# Patient Record
Sex: Female | Born: 1975 | Race: Black or African American | Hispanic: No | Marital: Married | Smoking: Former smoker
Health system: Southern US, Community
[De-identification: ages and names within clinical notes are randomized; demographics above are authoritative.]

## PROBLEM LIST (undated history)

## (undated) DIAGNOSIS — R002 Palpitations: Secondary | ICD-10-CM

## (undated) DIAGNOSIS — D649 Anemia, unspecified: Secondary | ICD-10-CM

## (undated) DIAGNOSIS — N946 Dysmenorrhea, unspecified: Secondary | ICD-10-CM

## (undated) DIAGNOSIS — F411 Generalized anxiety disorder: Secondary | ICD-10-CM

## (undated) DIAGNOSIS — F32A Depression, unspecified: Secondary | ICD-10-CM

## (undated) DIAGNOSIS — E119 Type 2 diabetes mellitus without complications: Secondary | ICD-10-CM

## (undated) DIAGNOSIS — Z973 Presence of spectacles and contact lenses: Secondary | ICD-10-CM

## (undated) DIAGNOSIS — D259 Leiomyoma of uterus, unspecified: Secondary | ICD-10-CM

## (undated) DIAGNOSIS — I10 Essential (primary) hypertension: Secondary | ICD-10-CM

## (undated) DIAGNOSIS — Z87410 Personal history of cervical dysplasia: Secondary | ICD-10-CM

## (undated) DIAGNOSIS — R519 Headache, unspecified: Secondary | ICD-10-CM

## (undated) DIAGNOSIS — N920 Excessive and frequent menstruation with regular cycle: Secondary | ICD-10-CM

## (undated) DIAGNOSIS — F419 Anxiety disorder, unspecified: Secondary | ICD-10-CM

## (undated) DIAGNOSIS — F329 Major depressive disorder, single episode, unspecified: Secondary | ICD-10-CM

## (undated) DIAGNOSIS — R51 Headache: Secondary | ICD-10-CM

## (undated) HISTORY — DX: Major depressive disorder, single episode, unspecified: F32.9

## (undated) HISTORY — DX: Depression, unspecified: F32.A

## (undated) HISTORY — DX: Headache: R51

## (undated) HISTORY — DX: Headache, unspecified: R51.9

---

## 1997-04-21 ENCOUNTER — Inpatient Hospital Stay (HOSPITAL_COMMUNITY): Admission: AD | Admit: 1997-04-21 | Discharge: 1997-04-21 | Payer: Self-pay | Admitting: Obstetrics and Gynecology

## 1997-05-13 ENCOUNTER — Inpatient Hospital Stay (HOSPITAL_COMMUNITY): Admission: AD | Admit: 1997-05-13 | Discharge: 1997-05-13 | Payer: Self-pay | Admitting: Obstetrics and Gynecology

## 1997-06-10 ENCOUNTER — Inpatient Hospital Stay (HOSPITAL_COMMUNITY): Admission: AD | Admit: 1997-06-10 | Discharge: 1997-06-10 | Payer: Self-pay | Admitting: Obstetrics and Gynecology

## 1997-07-22 ENCOUNTER — Encounter (HOSPITAL_COMMUNITY): Admission: RE | Admit: 1997-07-22 | Discharge: 1997-08-12 | Payer: Self-pay | Admitting: Obstetrics and Gynecology

## 1997-07-23 ENCOUNTER — Inpatient Hospital Stay (HOSPITAL_COMMUNITY): Admission: AD | Admit: 1997-07-23 | Discharge: 1997-07-23 | Payer: Self-pay | Admitting: Obstetrics and Gynecology

## 1997-07-28 ENCOUNTER — Inpatient Hospital Stay (HOSPITAL_COMMUNITY): Admission: AD | Admit: 1997-07-28 | Discharge: 1997-07-28 | Payer: Self-pay | Admitting: Obstetrics and Gynecology

## 1997-08-12 ENCOUNTER — Encounter (HOSPITAL_COMMUNITY): Admission: RE | Admit: 1997-08-12 | Discharge: 1997-09-04 | Payer: Self-pay | Admitting: Ophthalmology

## 1997-08-20 ENCOUNTER — Inpatient Hospital Stay (HOSPITAL_COMMUNITY): Admission: AD | Admit: 1997-08-20 | Discharge: 1997-08-20 | Payer: Self-pay | Admitting: Obstetrics and Gynecology

## 1997-08-27 ENCOUNTER — Other Ambulatory Visit: Admission: RE | Admit: 1997-08-27 | Discharge: 1997-08-27 | Payer: Self-pay | Admitting: Gynecology

## 1997-09-04 ENCOUNTER — Inpatient Hospital Stay (HOSPITAL_COMMUNITY): Admission: AD | Admit: 1997-09-04 | Discharge: 1997-09-07 | Payer: Self-pay | Admitting: Gynecology

## 1997-11-10 ENCOUNTER — Other Ambulatory Visit: Admission: RE | Admit: 1997-11-10 | Discharge: 1997-11-10 | Payer: Self-pay | Admitting: Gynecology

## 1997-12-09 ENCOUNTER — Emergency Department (HOSPITAL_COMMUNITY): Admission: EM | Admit: 1997-12-09 | Discharge: 1997-12-09 | Payer: Self-pay | Admitting: Emergency Medicine

## 1998-08-18 ENCOUNTER — Emergency Department (HOSPITAL_COMMUNITY): Admission: EM | Admit: 1998-08-18 | Discharge: 1998-08-19 | Payer: Self-pay | Admitting: Emergency Medicine

## 1999-12-21 ENCOUNTER — Other Ambulatory Visit: Admission: RE | Admit: 1999-12-21 | Discharge: 1999-12-21 | Payer: Self-pay | Admitting: Gynecology

## 1999-12-21 ENCOUNTER — Encounter (INDEPENDENT_AMBULATORY_CARE_PROVIDER_SITE_OTHER): Payer: Self-pay | Admitting: Specialist

## 2000-01-26 ENCOUNTER — Emergency Department (HOSPITAL_COMMUNITY): Admission: EM | Admit: 2000-01-26 | Discharge: 2000-01-26 | Payer: Self-pay | Admitting: *Deleted

## 2000-04-04 ENCOUNTER — Emergency Department (HOSPITAL_COMMUNITY): Admission: EM | Admit: 2000-04-04 | Discharge: 2000-04-04 | Payer: Self-pay | Admitting: Emergency Medicine

## 2001-01-01 ENCOUNTER — Other Ambulatory Visit: Admission: RE | Admit: 2001-01-01 | Discharge: 2001-01-01 | Payer: Self-pay | Admitting: Gynecology

## 2001-12-25 ENCOUNTER — Other Ambulatory Visit: Admission: RE | Admit: 2001-12-25 | Discharge: 2001-12-25 | Payer: Self-pay | Admitting: Family Medicine

## 2002-10-01 ENCOUNTER — Emergency Department (HOSPITAL_COMMUNITY): Admission: EM | Admit: 2002-10-01 | Discharge: 2002-10-01 | Payer: Self-pay | Admitting: Emergency Medicine

## 2003-02-08 ENCOUNTER — Emergency Department (HOSPITAL_COMMUNITY): Admission: EM | Admit: 2003-02-08 | Discharge: 2003-02-08 | Payer: Self-pay | Admitting: Emergency Medicine

## 2003-06-30 ENCOUNTER — Emergency Department (HOSPITAL_COMMUNITY): Admission: EM | Admit: 2003-06-30 | Discharge: 2003-06-30 | Payer: Self-pay | Admitting: Emergency Medicine

## 2005-01-17 ENCOUNTER — Other Ambulatory Visit: Admission: RE | Admit: 2005-01-17 | Discharge: 2005-01-17 | Payer: Self-pay | Admitting: Gynecology

## 2005-03-21 ENCOUNTER — Encounter: Admission: RE | Admit: 2005-03-21 | Discharge: 2005-06-19 | Payer: Self-pay | Admitting: Gynecology

## 2005-05-13 ENCOUNTER — Inpatient Hospital Stay (HOSPITAL_COMMUNITY): Admission: AD | Admit: 2005-05-13 | Discharge: 2005-05-13 | Payer: Self-pay | Admitting: Gynecology

## 2005-07-26 ENCOUNTER — Inpatient Hospital Stay (HOSPITAL_COMMUNITY): Admission: AD | Admit: 2005-07-26 | Discharge: 2005-07-28 | Payer: Self-pay | Admitting: Gynecology

## 2005-07-26 ENCOUNTER — Encounter (INDEPENDENT_AMBULATORY_CARE_PROVIDER_SITE_OTHER): Payer: Self-pay | Admitting: *Deleted

## 2005-09-13 ENCOUNTER — Other Ambulatory Visit: Admission: RE | Admit: 2005-09-13 | Discharge: 2005-09-13 | Payer: Self-pay | Admitting: Gynecology

## 2006-04-22 ENCOUNTER — Emergency Department (HOSPITAL_COMMUNITY): Admission: EM | Admit: 2006-04-22 | Discharge: 2006-04-22 | Payer: Self-pay | Admitting: Emergency Medicine

## 2007-01-18 ENCOUNTER — Other Ambulatory Visit: Admission: RE | Admit: 2007-01-18 | Discharge: 2007-01-18 | Payer: Self-pay | Admitting: Family Medicine

## 2007-03-01 ENCOUNTER — Emergency Department (HOSPITAL_COMMUNITY): Admission: EM | Admit: 2007-03-01 | Discharge: 2007-03-01 | Payer: Self-pay | Admitting: Emergency Medicine

## 2007-05-07 ENCOUNTER — Emergency Department (HOSPITAL_COMMUNITY): Admission: EM | Admit: 2007-05-07 | Discharge: 2007-05-07 | Payer: Self-pay | Admitting: Emergency Medicine

## 2008-03-14 HISTORY — PX: TUBAL LIGATION: SHX77

## 2008-07-29 ENCOUNTER — Emergency Department (HOSPITAL_COMMUNITY): Admission: EM | Admit: 2008-07-29 | Discharge: 2008-07-29 | Payer: Self-pay | Admitting: Emergency Medicine

## 2008-08-07 ENCOUNTER — Ambulatory Visit: Payer: Self-pay | Admitting: Gynecology

## 2008-08-08 ENCOUNTER — Ambulatory Visit: Payer: Self-pay | Admitting: Gynecology

## 2008-09-04 ENCOUNTER — Inpatient Hospital Stay (HOSPITAL_COMMUNITY): Admission: AD | Admit: 2008-09-04 | Discharge: 2008-09-05 | Payer: Self-pay | Admitting: Obstetrics and Gynecology

## 2009-03-13 ENCOUNTER — Inpatient Hospital Stay (HOSPITAL_COMMUNITY): Admission: AD | Admit: 2009-03-13 | Discharge: 2009-03-15 | Payer: Self-pay | Admitting: Obstetrics and Gynecology

## 2009-03-13 HISTORY — PX: TUBAL LIGATION: SHX77

## 2009-03-16 ENCOUNTER — Encounter (INDEPENDENT_AMBULATORY_CARE_PROVIDER_SITE_OTHER): Payer: Self-pay | Admitting: Obstetrics and Gynecology

## 2009-07-22 ENCOUNTER — Emergency Department (HOSPITAL_COMMUNITY): Admission: EM | Admit: 2009-07-22 | Discharge: 2009-07-22 | Payer: Self-pay | Admitting: Emergency Medicine

## 2010-03-04 ENCOUNTER — Inpatient Hospital Stay (HOSPITAL_COMMUNITY)
Admission: AD | Admit: 2010-03-04 | Discharge: 2010-03-04 | Payer: Self-pay | Source: Home / Self Care | Attending: Obstetrics & Gynecology | Admitting: Obstetrics & Gynecology

## 2010-03-25 ENCOUNTER — Ambulatory Visit: Admit: 2010-03-25 | Payer: Self-pay | Admitting: Obstetrics and Gynecology

## 2010-05-24 LAB — CBC
HCT: 34.2 % — ABNORMAL LOW (ref 36.0–46.0)
Hemoglobin: 11.4 g/dL — ABNORMAL LOW (ref 12.0–15.0)
MCH: 29.5 pg (ref 26.0–34.0)
MCHC: 33.3 g/dL (ref 30.0–36.0)
MCV: 88.4 fL (ref 78.0–100.0)
Platelets: 256 10*3/uL (ref 150–400)
RBC: 3.87 MIL/uL (ref 3.87–5.11)
RDW: 12.7 % (ref 11.5–15.5)
WBC: 7.1 10*3/uL (ref 4.0–10.5)

## 2010-05-24 LAB — URINALYSIS, ROUTINE W REFLEX MICROSCOPIC
Bilirubin Urine: NEGATIVE
Glucose, UA: NEGATIVE mg/dL
Ketones, ur: 15 mg/dL — AB
Nitrite: POSITIVE — AB
Protein, ur: >300 mg/dL — AB
Specific Gravity, Urine: >1.03 — ABNORMAL HIGH (ref 1.005–1.030)
Urobilinogen, UA: 2 mg/dL — ABNORMAL HIGH (ref 0.0–1.0)
pH: 5 (ref 5.0–8.0)

## 2010-05-24 LAB — URINE MICROSCOPIC-ADD ON

## 2010-05-24 LAB — POCT PREGNANCY, URINE: Preg Test, Ur: NEGATIVE

## 2010-05-30 LAB — GLUCOSE, CAPILLARY
Glucose-Capillary: 103 mg/dL — ABNORMAL HIGH (ref 70–99)
Glucose-Capillary: 105 mg/dL — ABNORMAL HIGH (ref 70–99)
Glucose-Capillary: 107 mg/dL — ABNORMAL HIGH (ref 70–99)
Glucose-Capillary: 126 mg/dL — ABNORMAL HIGH (ref 70–99)
Glucose-Capillary: 90 mg/dL (ref 70–99)

## 2010-05-30 LAB — CBC
HCT: 32.1 % — ABNORMAL LOW (ref 36.0–46.0)
Hemoglobin: 10.9 g/dL — ABNORMAL LOW (ref 12.0–15.0)
MCHC: 34 g/dL (ref 30.0–36.0)
MCV: 94.4 fL (ref 78.0–100.0)
Platelets: 375 10*3/uL (ref 150–400)
RBC: 3.4 MIL/uL — ABNORMAL LOW (ref 3.87–5.11)
RDW: 12.7 % (ref 11.5–15.5)
WBC: 11.9 10*3/uL — ABNORMAL HIGH (ref 4.0–10.5)

## 2010-06-01 LAB — DIFFERENTIAL
Basophils Absolute: 0 10*3/uL (ref 0.0–0.1)
Basophils Relative: 0 % (ref 0–1)
Eosinophils Absolute: 0 10*3/uL (ref 0.0–0.7)
Eosinophils Relative: 1 % (ref 0–5)
Lymphocytes Relative: 16 % (ref 12–46)
Lymphs Abs: 1.1 10*3/uL (ref 0.7–4.0)
Monocytes Absolute: 0.3 10*3/uL (ref 0.1–1.0)
Monocytes Relative: 4 % (ref 3–12)
Neutro Abs: 5.3 10*3/uL (ref 1.7–7.7)
Neutrophils Relative %: 79 % — ABNORMAL HIGH (ref 43–77)

## 2010-06-01 LAB — BASIC METABOLIC PANEL
BUN: 8 mg/dL (ref 6–23)
CO2: 27 mEq/L (ref 19–32)
Calcium: 8.8 mg/dL (ref 8.4–10.5)
Chloride: 106 mEq/L (ref 96–112)
Creatinine, Ser: 0.62 mg/dL (ref 0.4–1.2)
GFR calc Af Amer: >60 mL/min (ref >60)
GFR calc non Af Amer: >60 mL/min (ref >60)
Glucose, Bld: 109 mg/dL — ABNORMAL HIGH (ref 70–99)
Potassium: 3.2 mEq/L — ABNORMAL LOW (ref 3.5–5.1)
Sodium: 140 mEq/L (ref 135–145)

## 2010-06-01 LAB — URINALYSIS, ROUTINE W REFLEX MICROSCOPIC
Glucose, UA: NEGATIVE mg/dL
Nitrite: NEGATIVE
Protein, ur: 100 mg/dL — AB
Specific Gravity, Urine: 1.026 (ref 1.005–1.030)
Urobilinogen, UA: 1 mg/dL (ref 0.0–1.0)
pH: 6 (ref 5.0–8.0)

## 2010-06-01 LAB — CBC
HCT: 42.4 % (ref 36.0–46.0)
Hemoglobin: 14.3 g/dL (ref 12.0–15.0)
MCHC: 33.8 g/dL (ref 30.0–36.0)
MCV: 90.7 fL (ref 78.0–100.0)
Platelets: 342 10*3/uL (ref 150–400)
RBC: 4.67 MIL/uL (ref 3.87–5.11)
RDW: 12.5 % (ref 11.5–15.5)
WBC: 6.7 10*3/uL (ref 4.0–10.5)

## 2010-06-01 LAB — URINE CULTURE: Colony Count: 45000

## 2010-06-01 LAB — URINE MICROSCOPIC-ADD ON

## 2010-06-14 LAB — GLUCOSE, CAPILLARY
Glucose-Capillary: 86 mg/dL (ref 70–99)
Glucose-Capillary: 99 mg/dL (ref 70–99)

## 2010-06-14 LAB — RPR: RPR Ser Ql: NONREACTIVE

## 2010-06-14 LAB — CBC
HCT: 36.7 % (ref 36.0–46.0)
Hemoglobin: 12.1 g/dL (ref 12.0–15.0)
MCHC: 33 g/dL (ref 30.0–36.0)
MCV: 95.8 fL (ref 78.0–100.0)
Platelets: 407 10*3/uL — ABNORMAL HIGH (ref 150–400)
RBC: 3.83 MIL/uL — ABNORMAL LOW (ref 3.87–5.11)
RDW: 12.5 % (ref 11.5–15.5)
WBC: 11 10*3/uL — ABNORMAL HIGH (ref 4.0–10.5)

## 2010-06-21 LAB — URINE CULTURE
Colony Count: NO GROWTH
Culture: NO GROWTH

## 2010-06-21 LAB — URINALYSIS, ROUTINE W REFLEX MICROSCOPIC
Bilirubin Urine: NEGATIVE
Nitrite: NEGATIVE
Protein, ur: NEGATIVE mg/dL
Specific Gravity, Urine: 1.025 (ref 1.005–1.030)
Urobilinogen, UA: 1 mg/dL (ref 0.0–1.0)

## 2010-06-21 LAB — URINE MICROSCOPIC-ADD ON

## 2010-06-21 LAB — WET PREP, GENITAL: Trich, Wet Prep: NONE SEEN

## 2010-06-22 LAB — URINE MICROSCOPIC-ADD ON

## 2010-06-22 LAB — URINALYSIS, ROUTINE W REFLEX MICROSCOPIC
Bilirubin Urine: NEGATIVE
Ketones, ur: NEGATIVE mg/dL
Specific Gravity, Urine: 1.02 (ref 1.005–1.030)
pH: 6.5 (ref 5.0–8.0)

## 2010-07-30 NOTE — H&P (Signed)
NAME:  GLENDALE, WHERRY              ACCOUNT NO.:  [id number]   MEDICAL RECORD NO.:  [id number]          PATIENT TYPE:  INP   LOCATION:  9164                          FACILITY:  [facility]   PHYSICIAN:  Timothy P. Fontaine, M.D.DATE OF BIRTH:  09-12-1975   DATE OF ADMISSION:  07/26/2005  DATE OF DISCHARGE:                                HISTORY & PHYSICAL   CHIEF COMPLAINT:  1.  Pregnancy at 37-38 weeks.  2.  Insulin dependent gestational diabetes, well-controlled.  3.  History of shoulder dystocia, prior pregnancy.  4.  History of herpes simplex virus, type 2, no recent outbreaks.  5.  History of first trimester gonorrhea, treated, follow-up third trimester      screen negative.  6.  History of positive beta Streptococcus screening.   HISTORY OF PRESENT ILLNESS:  A 35 year old, G2 P9, female at 71-[redacted] weeks  gestation with history of prior shoulder dystocia with 8 pound 13 ounce  delivery, admitted for induction.  The patient is an insulin dependent  gestational diabetic with good glucose control, was found on recent  ultrasound to have her fetus to be 7 to 7-1/2 pounds in weight.  She was 2  cm dilatation and the issues and options for continued expectant management  vs. induction were reviewed.  The issues of gestational diabetes, delayed  lung maturity, and issues with postdelivery neonatal complications vs.  continued growth of the fetus and cesarean section near term to avoid risk  of shoulder dystocia vs. attempted vaginal delivery with the risk of  shoulder dystocia were discussed vs. induction now.  The issues of  ultrasound estimated fetal weight inaccuracies were also reviewed, and she  understands the infant may weigh more or less than the estimated fetal  weight.  After lengthy discussion prenatally on several occasions, the  patient wants to proceed with induction now to avoid the shoulder dystocia  as well as possible cesarean section in the future, clearly understanding  and  accepting the risks of induction with prematurity issues.  The options  for amniocentesis lung maturity check preinduction was also reviewed and at  37-38 weeks felt appropriate to either proceed with this or not, and thus  she is comfortable with not doing the amniocentesis.  She does have a  history of HSV in the past.  She has no history of current or recent  outbreaks and does have a history of positive GC in the first trimester.  A  third trimester screen was negative.  For the remainder of her history, see  her [facility].   PHYSICAL EXAMINATION:  HEENT:  Normal.  LUNGS:  Clear.  CARDIAC:  Regular rate without rubs, murmurs, or gallops.  ABDOMEN:  Benign, noting a gravid vertex uterus consistent with term.  External monitors show reactive fetal tracing without significant  contractions.  PELVIC:  She is actually 4 cm, 0 station, mid 80%, AROM fluid is clear.   ASSESSMENT/PLAN:  A 35 year old, G2 P27, female initially brought in for  induction, was 2 cm yesterday in the office, is 4 cm now, suspect prodromal  labor, and induction is actually a  mute point given her cervical dilatation  change over the last 24 hours.  AROM now, will augment with Pitocin,  antibiotic prophylaxis for Strep, and proceed with vaginal delivery.      Timothy P. Fontaine, M.D.  Electronically Signed     TPF/MEDQ  D:  07/26/2005  T:  07/26/2005  Job:  [id number]

## 2010-12-03 LAB — URINALYSIS, ROUTINE W REFLEX MICROSCOPIC
Leukocytes, UA: NEGATIVE
Nitrite: NEGATIVE
Specific Gravity, Urine: 1.01
Urobilinogen, UA: 1
pH: 7.5

## 2010-12-03 LAB — URINE MICROSCOPIC-ADD ON

## 2010-12-03 LAB — CBC
HCT: 38.2
MCHC: 34.1
MCV: 88.8
Platelets: 349
RDW: 13
WBC: 8.9

## 2010-12-03 LAB — DIFFERENTIAL
Basophils Relative: 0
Eosinophils Absolute: 0.1
Eosinophils Relative: 1
Lymphs Abs: 1.6

## 2010-12-03 LAB — I-STAT 8, (EC8 V) (CONVERTED LAB)
Acid-base deficit: 1
Bicarbonate: 25.4 — ABNORMAL HIGH
Chloride: 106
pCO2, Ven: 47.1
pH, Ven: 7.341 — ABNORMAL HIGH

## 2010-12-03 LAB — LIPASE, BLOOD: Lipase: 16

## 2011-01-27 ENCOUNTER — Encounter (HOSPITAL_COMMUNITY): Payer: Self-pay

## 2011-01-27 ENCOUNTER — Inpatient Hospital Stay (HOSPITAL_COMMUNITY)
Admission: AD | Admit: 2011-01-27 | Discharge: 2011-01-27 | Disposition: A | Discharge status: Home / Self Care | Payer: Managed Care, Other (non HMO) | Source: Ambulatory Visit | Attending: Obstetrics and Gynecology | Admitting: Obstetrics and Gynecology

## 2011-01-27 DIAGNOSIS — N921 Excessive and frequent menstruation with irregular cycle: Secondary | ICD-10-CM | POA: Insufficient documentation

## 2011-01-27 DIAGNOSIS — N949 Unspecified condition associated with female genital organs and menstrual cycle: Secondary | ICD-10-CM | POA: Insufficient documentation

## 2011-01-27 DIAGNOSIS — A499 Bacterial infection, unspecified: Secondary | ICD-10-CM | POA: Insufficient documentation

## 2011-01-27 DIAGNOSIS — I10 Essential (primary) hypertension: Secondary | ICD-10-CM | POA: Insufficient documentation

## 2011-01-27 DIAGNOSIS — N760 Acute vaginitis: Secondary | ICD-10-CM | POA: Insufficient documentation

## 2011-01-27 DIAGNOSIS — N938 Other specified abnormal uterine and vaginal bleeding: Secondary | ICD-10-CM | POA: Insufficient documentation

## 2011-01-27 DIAGNOSIS — B9689 Other specified bacterial agents as the cause of diseases classified elsewhere: Secondary | ICD-10-CM | POA: Insufficient documentation

## 2011-01-27 HISTORY — DX: Essential (primary) hypertension: I10

## 2011-01-27 HISTORY — DX: Anxiety disorder, unspecified: F41.9

## 2011-01-27 LAB — WET PREP, GENITAL
Trich, Wet Prep: NONE SEEN
Yeast Wet Prep HPF POC: NONE SEEN

## 2011-01-27 LAB — URINALYSIS, ROUTINE W REFLEX MICROSCOPIC
Glucose, UA: NEGATIVE mg/dL
Leukocytes, UA: NEGATIVE
pH: 7 (ref 5.0–8.0)

## 2011-01-27 LAB — CBC
HCT: 34.3 % — ABNORMAL LOW (ref 36.0–46.0)
Hemoglobin: 11.4 g/dL — ABNORMAL LOW (ref 12.0–15.0)
WBC: 7.4 10*3/uL (ref 4.0–10.5)

## 2011-01-27 MED ORDER — METRONIDAZOLE 250 MG PO TABS: 250.0000 mg | ORAL_TABLET | Freq: Three times a day (TID) | ORAL | Status: AC

## 2011-01-27 NOTE — Progress Notes (Signed)
Pt states had normal period 11/1-11/5. Pink spotting since Tuesday. States had a "gush" on Tuesday night with a large red clot, then had another gush today with a large red clot. States had an episode of sharp low back pain on Tuesday immediately prior to the gush. Denies any other complaints.

## 2011-01-27 NOTE — ED Provider Notes (Signed)
History     Chief Complaint  Patient presents with  . Vaginal Bleeding   HPI 35 y.o. BF presents with c/o irregular vaginal bleeding. States had normal period 2 weeks ago then had one day of bleeding 2 days ago and today. No fever, N/V/D/C. Had similar episode a year ago. Is concerned this is caused by her BTL.  She was seen by Dr Estanislado Pandy in 03/2009 for BTL and then again in 02/2010 in [facility] for DUB.  States has appointment with Dr Estanislado Pandy in January but does not want to wait for that. States "something is wrong ever since my tubal and I want to be tested for everything that could cause abnormal bleeding".   OB History    Grav Para Term Preterm Abortions TAB SAB Ect Mult Living   3 3 3  0 0 0 0 0 0 3      Past Medical History  Diagnosis Date  . Diabetes mellitus     type 2. no meds  . Hypertension     never been treated  . Anxiety   Patient states is treated by a Family Practice MD for Hypertension but he "doesn't think I need meds for my blood pressure".  Has appointment with him next week.   Past Surgical History  Procedure Date  . Tubal ligation 2010    History reviewed. No pertinent family history.  History  Substance Use Topics  . Smoking status: Current Everyday Smoker -- 0.2 packs/day for 10 years    Types: Cigarettes  . Smokeless tobacco: Not on file  . Alcohol Use:     Allergies: No Known Allergies  Prescriptions prior to admission  Medication Sig Dispense Refill  . acetaminophen (TYLENOL) 500 MG tablet Take 500 mg by mouth daily as needed. pain         Review of Systems  Constitutional: Negative for fever, chills and malaise/fatigue.  Gastrointestinal: Negative for nausea and vomiting.  Genitourinary:       Vaginal bleeding  Neurological: Negative for dizziness.   As above. Physical Exam   Blood pressure 171/100, pulse 72, temperature 98.3 F (36.8 C), temperature source Oral, resp. rate 18, height 5' 7.5" (1.715 m), weight 199 lb 6.4 oz (90.447 kg), last  menstrual period 01/13/2011, SpO2 99.00%.  Physical Exam  Constitutional: She is oriented to person, place, and time. She appears well-developed and well-nourished. No distress.  HENT:  Head: Normocephalic.  Cardiovascular: Normal rate.   Respiratory: Effort normal.  GI: Soft. She exhibits no distension and no mass. There is no tenderness. There is no rebound and no guarding.  Genitourinary: Uterus normal. Vaginal discharge (scant blood tinged mucous) found.  Musculoskeletal: Normal range of motion.  Neurological: She is alert and oriented to person, place, and time.  Skin: Skin is warm and dry.  Psychiatric: She has a normal mood and affect.   CBC    Component Value Date/Time   WBC 7.4 01/27/2011 1405   RBC 3.81* 01/27/2011 1405   HGB 11.4* 01/27/2011 1405   HCT 34.3* 01/27/2011 1405   PLT 315 01/27/2011 1405   MCV 90.0 01/27/2011 1405   MCH 29.9 01/27/2011 1405   MCHC 33.2 01/27/2011 1405   RDW 12.8 01/27/2011 1405   LYMPHSABS 1.1 07/22/2009 1241   MONOABS 0.3 07/22/2009 1241   EOSABS 0.0 07/22/2009 1241   BASOSABS 0.0 07/22/2009 1241   UPT Negative Wet prep Few Clue Cells  MAU Course  Procedures   Assessment and Plan  A:  Dysfunctional Uterine Bleeding, Metrorrhagia      Hypertension      Bacterial Vaginosis  P:  Discussed with Dr Pennie Rushing. Discussed also with patient usual protocol for ED management of DUB in a non-hemorrhagic state is what we did today with further workup more appropriately done in the office setting with her doctor.   Patient initially insisted on more testing, but upon my return to the room, states she did just read something about Perimenopause and how that can cause irregular bleeding patterns.   Plan followup next week with E. [facility] PAC on 02/02/11 as scheduled  Ibuprofen PRN for cramping and bleeding.   Follow up HTN with primary doctor next week. Advised consistent BPs at this level need to be treated.   Wynelle Bourgeois 01/27/2011, 3:01 PM

## 2011-01-27 NOTE — Progress Notes (Signed)
Cindy Curry CNM notified of pt BPs of 180/93 & 171/100.

## 2011-01-28 LAB — GC/CHLAMYDIA PROBE AMP, GENITAL: Chlamydia, DNA Probe: NEGATIVE

## 2011-08-23 ENCOUNTER — Emergency Department (HOSPITAL_COMMUNITY): Payer: Managed Care, Other (non HMO)

## 2011-08-23 ENCOUNTER — Encounter (HOSPITAL_COMMUNITY): Payer: Self-pay | Admitting: *Deleted

## 2011-08-23 ENCOUNTER — Emergency Department (HOSPITAL_COMMUNITY)
Admission: EM | Admit: 2011-08-23 | Discharge: 2011-08-23 | Disposition: A | Discharge status: Home / Self Care | Payer: Managed Care, Other (non HMO) | Attending: Emergency Medicine | Admitting: Emergency Medicine

## 2011-08-23 DIAGNOSIS — F17200 Nicotine dependence, unspecified, uncomplicated: Secondary | ICD-10-CM | POA: Insufficient documentation

## 2011-08-23 DIAGNOSIS — Z79899 Other long term (current) drug therapy: Secondary | ICD-10-CM | POA: Insufficient documentation

## 2011-08-23 DIAGNOSIS — F411 Generalized anxiety disorder: Secondary | ICD-10-CM | POA: Insufficient documentation

## 2011-08-23 DIAGNOSIS — E119 Type 2 diabetes mellitus without complications: Secondary | ICD-10-CM | POA: Insufficient documentation

## 2011-08-23 DIAGNOSIS — R42 Dizziness and giddiness: Secondary | ICD-10-CM | POA: Insufficient documentation

## 2011-08-23 DIAGNOSIS — I10 Essential (primary) hypertension: Secondary | ICD-10-CM | POA: Insufficient documentation

## 2011-08-23 DIAGNOSIS — R55 Syncope and collapse: Secondary | ICD-10-CM | POA: Insufficient documentation

## 2011-08-23 DIAGNOSIS — R51 Headache: Secondary | ICD-10-CM | POA: Insufficient documentation

## 2011-08-23 LAB — POCT I-STAT TROPONIN I: Troponin i, poc: 0 ng/mL (ref 0.00–0.08)

## 2011-08-23 LAB — BASIC METABOLIC PANEL
BUN: 7 mg/dL (ref 6–23)
Chloride: 103 mEq/L (ref 96–112)
GFR calc Af Amer: >90 mL/min (ref >90)
GFR calc non Af Amer: >90 mL/min (ref >90)
Potassium: 3.2 mEq/L — ABNORMAL LOW (ref 3.5–5.1)
Sodium: 138 mEq/L (ref 135–145)

## 2011-08-23 LAB — CBC
HCT: 38 % (ref 36.0–46.0)
Platelets: 301 10*3/uL (ref 150–400)
RDW: 13 % (ref 11.5–15.5)
WBC: 7.7 10*3/uL (ref 4.0–10.5)

## 2011-08-23 LAB — GLUCOSE, CAPILLARY: Glucose-Capillary: 98 mg/dL (ref 70–99)

## 2011-08-23 MED ORDER — AMLODIPINE BESYLATE 10 MG PO TABS: 10.0000 mg | ORAL_TABLET | Freq: Every day | ORAL | Status: DC

## 2011-08-23 MED ORDER — HYDROCHLOROTHIAZIDE 25 MG PO TABS: 25.0000 mg | ORAL_TABLET | Freq: Every day | ORAL | Status: DC

## 2011-08-23 NOTE — ED Notes (Signed)
PA-C came out of room and reported pt was leaving AMA. Pt upset about her wait time, was speaking in a raised voice, stated that she was leaving "because this is ridiculous and I have kids at home" Pt received cell phone call and began yelling and cursing into the phone. When off the phone pt stated she would stay but wanted another provider.

## 2011-08-23 NOTE — ED Provider Notes (Signed)
History     CSN: [id number]  Arrival date & time 08/23/11  1128   First MD Initiated Contact with Patient 08/23/11 1349      Chief Complaint  Patient presents with  . Headache  . Dizziness  . Loss of Consciousness    (Consider location/radiation/quality/duration/timing/severity/associated sxs/prior treatment) HPI...Marland KitchenMarland KitchenMarland Kitchenheadaches for 2 weeks described as pulsating. No neurological deficits, fever, stiff neck.  Claims to have had syncopal spells. Nothing makes symptoms better or worse. Blood pressure has been high. Symptoms are minimal to moderate.  Past Medical History  Diagnosis Date  . Diabetes mellitus     type 2. no meds  . Hypertension     never been treated  . Anxiety     Past Surgical History  Procedure Date  . Tubal ligation 2010    No family history on file.  History  Substance Use Topics  . Smoking status: Current Everyday Smoker -- 0.2 packs/day for 10 years    Types: Cigarettes  . Smokeless tobacco: Not on file  . Alcohol Use: No    OB History    Grav Para Term Preterm Abortions TAB SAB Ect Mult Living   3 3 3  0 0 0 0 0 0 3      Review of Systems  All other systems reviewed and are negative.    Allergies  Review of patient's allergies indicates no known allergies.  Home Medications  No current outpatient prescriptions on file.  BP 173/94  Pulse 81  Temp(Src) 97.8 F (36.6 C) (Oral)  Resp 18  Ht 5\' 7"  (1.702 m)  Wt 183 lb (83.008 kg)  BMI 28.66 kg/m2  SpO2 99%  Physical Exam  Nursing note and vitals reviewed. Constitutional: She is oriented to person, place, and time. She appears well-developed and well-nourished.       hypertensive  HENT:  Head: Normocephalic and atraumatic.  Eyes: Conjunctivae and EOM are normal. Pupils are equal, round, and reactive to light.  Neck: Normal range of motion. Neck supple.  Cardiovascular: Normal rate and regular rhythm.   Pulmonary/Chest: Effort normal and breath sounds normal.  Abdominal: Soft.  Bowel sounds are normal.  Musculoskeletal: Normal range of motion.  Neurological: She is alert and oriented to person, place, and time.  Skin: Skin is warm and dry.  Psychiatric: She has a normal mood and affect.    ED Course  Procedures (including critical care time)  Labs Reviewed  BASIC METABOLIC PANEL - Abnormal; Notable for the following:    Potassium 3.2 (*)     All other components within normal limits  CBC  GLUCOSE, CAPILLARY  POCT I-STAT TROPONIN I  LAB REPORT - SCANNED   No results found.   No diagnosis found.  Date: 08/23/2011  Rate: 87  Rhythm: normal sinus rhythm  QRS Axis: right  Intervals: normal  ST/T Wave abnormalities: normal  Conduction Disutrbances:none  Narrative Interpretation:   Old EKG Reviewed: none available c SA   CT head and chest x-ray negative  MDM  Patient has normal exam in the emergency department.   CT head, chest x-ray, EKG, hemoglobin normal. Potassium slightly low. Patient is in no acute distress. Discharged to primary care.        Donnetta Hutching, MD 09/07/11 [phone]

## 2011-08-23 NOTE — ED Provider Notes (Signed)
History     CSN: [id number]  Arrival date & time 08/23/11  1128   First MD Initiated Contact with Patient 08/23/11 1349      Chief Complaint  Patient presents with  . Headache  . Dizziness  . Loss of Consciousness    (Consider location/radiation/quality/duration/timing/severity/associated sxs/prior treatment) Patient is a 36 y.o. female presenting with headaches and syncope. The history is provided by the patient.  Headache  This is a recurrent problem. Associated symptoms include syncope. Pertinent negatives include no shortness of breath, no nausea and no vomiting.  Loss of Consciousness Associated symptoms include headaches. Pertinent negatives include no chest pain, no abdominal pain and no shortness of breath.   patient has had headaches on and off for the last 2 weeks. She states it is a throbbing pain starts in the back of her head and then moves around her head. It comes and goes. No trauma. She's not had headaches at this before. She's not currently on any medications. She's a history of both diabetes and hypertension. No numbness or weakness. She did state that earlier today she had a nosebleed. It has resolved on its own. No chest pain. No localizing numbness or weakness. No fevers. No photophobia. She has had episodes where she states that she stands up and feels like she's going to pass out. She actually has had a couple episodes of syncope. Patient states that she injured her left foot with one of the falls.  Past Medical History  Diagnosis Date  . Diabetes mellitus     type 2. no meds  . Hypertension     never been treated  . Anxiety     Past Surgical History  Procedure Date  . Tubal ligation 2010    No family history on file.  History  Substance Use Topics  . Smoking status: Current Everyday Smoker -- 0.2 packs/day for 10 years    Types: Cigarettes  . Smokeless tobacco: Not on file  . Alcohol Use: No    OB History    Grav Para Term Preterm Abortions  TAB SAB Ect Mult Living   3 3 3  0 0 0 0 0 0 3      Review of Systems  Constitutional: Negative for activity change and appetite change.  HENT: Negative for neck stiffness.   Eyes: Negative for pain.  Respiratory: Negative for chest tightness and shortness of breath.   Cardiovascular: Positive for syncope. Negative for chest pain and leg swelling.  Gastrointestinal: Negative for nausea, vomiting, abdominal pain and diarrhea.  Genitourinary: Negative for flank pain.  Musculoskeletal: Negative for back pain.  Skin: Negative for rash.  Neurological: Positive for light-headedness and headaches. Negative for weakness and numbness.  Psychiatric/Behavioral: Negative for behavioral problems.    Allergies  Review of patient's allergies indicates no known allergies.  Home Medications   Current Outpatient Rx  Name Route Sig Dispense Refill  . AMLODIPINE BESYLATE 10 MG PO TABS Oral Take 1 tablet (10 mg total) by mouth daily. 30 tablet 0  . HYDROCHLOROTHIAZIDE 25 MG PO TABS Oral Take 1 tablet (25 mg total) by mouth daily. 30 tablet 0    BP 185/97  Pulse 68  Temp(Src) 97.9 F (36.6 C) (Oral)  Resp 15  Ht 5\' 7"  (1.702 m)  Wt 183 lb (83.008 kg)  BMI 28.66 kg/m2  SpO2 99%  LMP 08/07/2011  Physical Exam  Nursing note and vitals reviewed. Constitutional: She is oriented to person, place, and time. She appears well-developed  and well-nourished.  HENT:  Head: Normocephalic and atraumatic.  Eyes: EOM are normal. Pupils are equal, round, and reactive to light.  Neck: Normal range of motion. Neck supple.  Cardiovascular: Normal rate, regular rhythm and normal heart sounds.   No murmur heard.      Hypertension  Pulmonary/Chest: Effort normal and breath sounds normal. No respiratory distress. She has no wheezes. She has no rales.  Abdominal: Soft. Bowel sounds are normal. She exhibits no distension. There is no tenderness. There is no rebound and no guarding.  Musculoskeletal: Normal range  of motion.  Neurological: She is alert and oriented to person, place, and time. No cranial nerve deficit.       Finger-nose intact bilaterally.  Skin: Skin is warm and dry.  Psychiatric: She has a normal mood and affect. Her speech is normal.    ED Course  Procedures (including critical care time)  Labs Reviewed  BASIC METABOLIC PANEL - Abnormal; Notable for the following:    Potassium 3.2 (*)    All other components within normal limits  CBC  GLUCOSE, CAPILLARY  POCT I-STAT TROPONIN I   Dg Chest 2 View  08/23/2011  *RADIOLOGY REPORT*  Clinical Data: Hypertension.  CHEST - 2 VIEW  Comparison: No priors.  Findings: Lung volumes are normal.  No consolidative airspace disease.  No pleural effusions.  No pneumothorax.  No pulmonary nodule or mass noted.  Pulmonary vasculature and the cardiomediastinal silhouette are within normal limits.  IMPRESSION: 1. No radiographic evidence of acute cardiopulmonary disease.  Original Report Authenticated By: Florencia Reasons, M.D.   Ct Head Wo Contrast  08/23/2011  *RADIOLOGY REPORT*  Clinical Data:   Headache and dizziness.  2 weeks of syncopal episodes.  Hypertensive.  CT HEAD WITHOUT CONTRAST  Technique:  Contiguous axial images were obtained from the base of the skull through the vertex without contrast.  Comparison: 02/08/2003  Findings: Bone windows demonstrate fluid within the right sphenoid sinus. Clear mastoid air cells.  Soft tissue windows demonstrate no  mass lesion, hemorrhage, hydrocephalus, acute infarct, intra-axial, or extra-axial fluid collection.  IMPRESSION:  1. No acute intracranial abnormality. 2.  Sinus disease.  Original Report Authenticated By: Consuello Bossier, M.D.     1. Headache   2. Hypertension      Date: 08/23/2011  Rate: 87  Rhythm: normal sinus rhythm  QRS Axis: right  Intervals: normal  ST/T Wave abnormalities: normal  Conduction Disutrbances:none  Narrative Interpretation:   Old EKG Reviewed:  unchanged    MDM  Patient with headache for 2 weeks. It comes and goes. Also near syncope. His history of diabetes and hypertension. Her blood pressure is high. CT done due to the headaches do not show any bleed or stroke. No signs of endorgan damage with hypertension. Patient started on antihypertension discharged home to followup with PCP        Juliet Rude. Rubin Payor, MD 08/24/11 0000

## 2011-08-23 NOTE — ED Notes (Signed)
Pt refused to go to the bathroom for urine sample. Said she has not had anything to eat or drink today and cannot go. Pt was speaking in raised voice and said she has been here too long

## 2011-08-23 NOTE — ED Notes (Signed)
NAD noted at time of d/c home. Pt verbalized understanding of d/c inst. 

## 2011-08-23 NOTE — ED Provider Notes (Signed)
3:13 PM I walked in to see pt. Asked her what brought her here. Pt right away became agitated, stated she is tired of waiting and stated she wants to go home. I explained to her, i cannot discharge her, because I have not seen her, gotten history, or examined her. Explained to her that I do not know what is going on with her, and do not know if it is safe for her to go home without her giving any history. Pt insisted on going home. Explained she will have to leave AMA.   Lottie Mussel, [place] 08/25/11 [phone]

## 2011-08-23 NOTE — Discharge Instructions (Signed)
Arterial Hypertension Arterial hypertension (high blood pressure) is a condition of elevated pressure in your blood vessels. Hypertension over a long period of time is a risk factor for strokes, heart attacks, and heart failure. It is also the leading cause of kidney (renal) failure.  CAUSES   In Adults -- Over 90% of all hypertension has no known cause. This is called essential or primary hypertension. In the other 10% of people with hypertension, the increase in blood pressure is caused by another disorder. This is called secondary hypertension. Important causes of secondary hypertension are:   Heavy alcohol use.   Obstructive sleep apnea.   Hyperaldosterosim (Conn's syndrome).   Steroid use.   Chronic kidney failure.   Hyperparathyroidism.   Medications.   Renal artery stenosis.   Pheochromocytoma.   Cushing's disease.   Coarctation of the aorta.   Scleroderma renal crisis.   Licorice (in excessive amounts).   Drugs (cocaine, methamphetamine).  Your caregiver can explain any items above that apply to you.  In Children -- Secondary hypertension is more common and should always be considered.   Pregnancy -- Few women of childbearing age have high blood pressure. However, up to 10% of them develop hypertension of pregnancy. Generally, this will not harm the woman. It may be a sign of 3 complications of pregnancy: preeclampsia, HELLP syndrome, and eclampsia. Follow up and control with medication is necessary.  SYMPTOMS   This condition normally does not produce any noticeable symptoms. It is usually found during a routine exam.   Malignant hypertension is a late problem of high blood pressure. It may have the following symptoms:   Headaches.   Blurred vision.   End-organ damage (this means your kidneys, heart, lungs, and other organs are being damaged).   Stressful situations can increase the blood pressure. If a person with normal blood pressure has their blood  pressure go up while being seen by their caregiver, this is often termed "white coat hypertension." Its importance is not known. It may be related with eventually developing hypertension or complications of hypertension.   Hypertension is often confused with mental tension, stress, and anxiety.  DIAGNOSIS  The diagnosis is made by 3 separate blood pressure measurements. They are taken at least 1 week apart from each other. If there is organ damage from hypertension, the diagnosis may be made without repeat measurements. Hypertension is usually identified by having blood pressure readings:  Above 140/90 mmHg measured in both arms, at 3 separate times, over a couple weeks.   Over 130/80 mmHg should be considered a risk factor and may require treatment in patients with diabetes.  Blood pressure readings over 120/80 mmHg are called "pre-hypertension" even in non-diabetic patients. To get a true blood pressure measurement, use the following guidelines. Be aware of the factors that can alter blood pressure readings.  Take measurements at least 1 hour after caffeine.   Take measurements 30 minutes after smoking and without any stress. This is another reason to quit smoking - it raises your blood pressure.   Use a proper cuff size. Ask your caregiver if you are not sure about your cuff size.   Most home blood pressure cuffs are automatic. They will measure systolic and diastolic pressures. The systolic pressure is the pressure reading at the start of sounds. Diastolic pressure is the pressure at which the sounds disappear. If you are elderly, measure pressures in multiple postures. Try sitting, lying or standing.   Sit at rest for a minimum of   5 minutes before taking measurements.   You should not be on any medications like decongestants. These are found in many cold medications.   Record your blood pressure readings and review them with your caregiver.  If you have hypertension:  Your caregiver  may do tests to be sure you do not have secondary hypertension (see "causes" above).   Your caregiver may also look for signs of metabolic syndrome. This is also called Syndrome X or Insulin Resistance Syndrome. You may have this syndrome if you have type 2 diabetes, abdominal obesity, and abnormal blood lipids in addition to hypertension.   Your caregiver will take your medical and family history and perform a physical exam.   Diagnostic tests may include blood tests (for glucose, cholesterol, potassium, and kidney function), a urinalysis, or an EKG. Other tests may also be necessary depending on your condition.  PREVENTION  There are important lifestyle issues that you can adopt to reduce your chance of developing hypertension:  Maintain a normal weight.   Limit the amount of salt (sodium) in your diet.   Exercise often.   Limit alcohol intake.   Get enough potassium in your diet. Discuss specific advice with your caregiver.   Follow a DASH diet (dietary approaches to stop hypertension). This diet is rich in fruits, vegetables, and low-fat dairy products, and avoids certain fats.  PROGNOSIS  Essential hypertension cannot be cured. Lifestyle changes and medical treatment can lower blood pressure and reduce complications. The prognosis of secondary hypertension depends on the underlying cause. Many people whose hypertension is controlled with medicine or lifestyle changes can live a normal, healthy life.  RISKS AND COMPLICATIONS  While high blood pressure alone is not an illness, it often requires treatment due to its short- and long-term effects on many organs. Hypertension increases your risk for:  CVAs or strokes (cerebrovascular accident).   Heart failure due to chronically high blood pressure (hypertensive cardiomyopathy).   Heart attack (myocardial infarction).   Damage to the retina (hypertensive retinopathy).   Kidney failure (hypertensive nephropathy).  Your caregiver can  explain list items above that apply to you. Treatment of hypertension can significantly reduce the risk of complications. TREATMENT   For overweight patients, weight loss and regular exercise are recommended. Physical fitness lowers blood pressure.   Mild hypertension is usually treated with diet and exercise. A diet rich in fruits and vegetables, fat-free dairy products, and foods low in fat and salt (sodium) can help lower blood pressure. Decreasing salt intake decreases blood pressure in a 1/3 of people.   Stop smoking if you are a smoker.  The steps above are highly effective in reducing blood pressure. While these actions are easy to suggest, they are difficult to achieve. Most patients with moderate or severe hypertension end up requiring medications to bring their blood pressure down to a normal level. There are several classes of medications for treatment. Blood pressure pills (antihypertensives) will lower blood pressure by their different actions. Lowering the blood pressure by 10 mmHg may decrease the risk of complications by as much as 25%. The goal of treatment is effective blood pressure control. This will reduce your risk for complications. Your caregiver will help you determine the best treatment for you according to your lifestyle. What is excellent treatment for one person, may not be for you. HOME CARE INSTRUCTIONS   Do not smoke.   Follow the lifestyle changes outlined in the "Prevention" section.   If you are on medications, follow the directions   carefully. Blood pressure medications must be taken as prescribed. Skipping doses reduces their benefit. It also puts you at risk for problems.   Follow up with your caregiver, as directed.   If you are asked to monitor your blood pressure at home, follow the guidelines in the "Diagnosis" section above.  SEEK MEDICAL CARE IF:   You think you are having medication side effects.   You have recurrent headaches or lightheadedness.     You have swelling in your ankles.   You have trouble with your vision.  SEEK IMMEDIATE MEDICAL CARE IF:   You have sudden onset of chest pain or pressure, difficulty breathing, or other symptoms of a heart attack.   You have a severe headache.   You have symptoms of a stroke (such as sudden weakness, difficulty speaking, difficulty walking).  MAKE SURE YOU:   Understand these instructions.   Will watch your condition.   Will get help right away if you are not doing well or get worse.  Document Released: 02/28/2005 Document Revised: 02/17/2011 Document Reviewed: 09/28/2006 [employer] Patient Information 2012 [employer]. 

## 2011-08-23 NOTE — ED Notes (Signed)
Patient states she has had a headache for 2 weeks with syncope episodes.  Patient states her pain is constant.  She had onset of nosebleed today.  She is also hypertensive today.  She complains of pulsating headache.

## 2011-08-26 NOTE — ED Provider Notes (Signed)
Medical screening examination/treatment/procedure(s) were performed by non-physician practitioner and as supervising physician I was immediately available for consultation/collaboration.  Juliet Rude. Rubin Payor, MD 08/26/11 [phone]

## 2013-06-12 ENCOUNTER — Emergency Department (HOSPITAL_COMMUNITY): Payer: No Typology Code available for payment source

## 2013-06-12 ENCOUNTER — Encounter (HOSPITAL_COMMUNITY): Payer: Self-pay | Admitting: Emergency Medicine

## 2013-06-12 ENCOUNTER — Emergency Department (HOSPITAL_COMMUNITY)
Admission: EM | Admit: 2013-06-12 | Discharge: 2013-06-12 | Disposition: A | Discharge status: Home / Self Care | Payer: No Typology Code available for payment source | Attending: Emergency Medicine | Admitting: Emergency Medicine

## 2013-06-12 DIAGNOSIS — IMO0002 Reserved for concepts with insufficient information to code with codable children: Secondary | ICD-10-CM | POA: Insufficient documentation

## 2013-06-12 DIAGNOSIS — Z8659 Personal history of other mental and behavioral disorders: Secondary | ICD-10-CM | POA: Insufficient documentation

## 2013-06-12 DIAGNOSIS — Y92410 Unspecified street and highway as the place of occurrence of the external cause: Secondary | ICD-10-CM | POA: Insufficient documentation

## 2013-06-12 DIAGNOSIS — E119 Type 2 diabetes mellitus without complications: Secondary | ICD-10-CM | POA: Insufficient documentation

## 2013-06-12 DIAGNOSIS — Y9389 Activity, other specified: Secondary | ICD-10-CM | POA: Insufficient documentation

## 2013-06-12 DIAGNOSIS — F17200 Nicotine dependence, unspecified, uncomplicated: Secondary | ICD-10-CM | POA: Insufficient documentation

## 2013-06-12 DIAGNOSIS — Z791 Long term (current) use of non-steroidal anti-inflammatories (NSAID): Secondary | ICD-10-CM | POA: Insufficient documentation

## 2013-06-12 DIAGNOSIS — S29011A Strain of muscle and tendon of front wall of thorax, initial encounter: Secondary | ICD-10-CM

## 2013-06-12 MED ORDER — HYDROCODONE-ACETAMINOPHEN 5-325 MG PO TABS: 1.0000 | ORAL_TABLET | Freq: Four times a day (QID) | ORAL | Status: DC | PRN

## 2013-06-12 MED ORDER — IBUPROFEN 800 MG PO TABS: 800.0000 mg | ORAL_TABLET | Freq: Three times a day (TID) | ORAL | Status: DC
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
## 2013-06-12 NOTE — ED Provider Notes (Signed)
Medical screening examination/treatment/procedure(s) were performed by non-physician practitioner and as supervising physician I was immediately available for consultation/collaboration.   EKG Interpretation None        Hoy Morn, MD 06/12/13 [phone]

## 2013-06-12 NOTE — Progress Notes (Signed)
[facility] provided pt with a list of primary care resources to help patient establish primary care.  °
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
## 2014-01-13 ENCOUNTER — Encounter (HOSPITAL_COMMUNITY): Payer: Self-pay | Admitting: Emergency Medicine

## 2014-05-08 ENCOUNTER — Encounter: Payer: Self-pay | Admitting: Family

## 2014-05-08 ENCOUNTER — Ambulatory Visit (INDEPENDENT_AMBULATORY_CARE_PROVIDER_SITE_OTHER): Payer: BLUE CROSS/BLUE SHIELD | Admitting: Family

## 2014-05-08 ENCOUNTER — Other Ambulatory Visit (INDEPENDENT_AMBULATORY_CARE_PROVIDER_SITE_OTHER): Payer: BLUE CROSS/BLUE SHIELD

## 2014-05-08 VITALS — BP 190/100 | HR 98 | Temp 98.3°F | Ht 67.0 in | Wt 224.2 lb

## 2014-05-08 DIAGNOSIS — E1165 Type 2 diabetes mellitus with hyperglycemia: Secondary | ICD-10-CM

## 2014-05-08 DIAGNOSIS — F17200 Nicotine dependence, unspecified, uncomplicated: Secondary | ICD-10-CM | POA: Insufficient documentation

## 2014-05-08 DIAGNOSIS — I10 Essential (primary) hypertension: Secondary | ICD-10-CM

## 2014-05-08 DIAGNOSIS — IMO0002 Reserved for concepts with insufficient information to code with codable children: Secondary | ICD-10-CM

## 2014-05-08 DIAGNOSIS — Z720 Tobacco use: Secondary | ICD-10-CM

## 2014-05-08 LAB — BASIC METABOLIC PANEL
BUN: 9 mg/dL (ref 6–23)
CALCIUM: 9.1 mg/dL (ref 8.4–10.5)
CO2: 28 mEq/L (ref 19–32)
Chloride: 107 mEq/L (ref 96–112)
Creatinine, Ser: 0.82 mg/dL (ref 0.40–1.20)
GFR: 99.94 mL/min (ref >60.00)
Glucose, Bld: 96 mg/dL (ref 70–99)
Potassium: 3.3 mEq/L — ABNORMAL LOW (ref 3.5–5.1)
Sodium: 140 mEq/L (ref 135–145)

## 2014-05-08 LAB — HEMOGLOBIN A1C: HEMOGLOBIN A1C: 6.1 % (ref 4.6–6.5)

## 2014-05-08 LAB — MICROALBUMIN / CREATININE URINE RATIO
Creatinine,U: 221.5 mg/dL
Microalb Creat Ratio: 3.4 mg/g (ref 0.0–30.0)
Microalb, Ur: 7.5 mg/dL — ABNORMAL HIGH (ref 0.0–1.9)

## 2014-05-08 MED ORDER — CHLORTHALIDONE 25 MG PO TABS: 25.0000 mg | ORAL_TABLET | Freq: Every day | ORAL | Status: DC

## 2014-05-08 MED ORDER — LISINOPRIL 20 MG PO TABS: 20.0000 mg | ORAL_TABLET | Freq: Every day | ORAL | Status: DC

## 2014-05-08 NOTE — Assessment & Plan Note (Signed)
Patient counseled on tobacco cessation methods. She is contemplating the behavior change.  Information was provided regarding Zyban and Chantix. Patient will research medication and information provided. Follow up when ready to begin.

## 2014-05-08 NOTE — Assessment & Plan Note (Signed)
Hypertension remains uncontrolled despite increase in lisinopril recently. Start chlorthalidone daily. Patient will schedule eye exam. Obtain basic metabolic panel to check kidney function. Follow-up in 2 weeks.

## 2014-05-08 NOTE — Patient Instructions (Signed)
Thank you for choosing [facility].  Summary/Instructions:  Your prescription(s) have been submitted to your pharmacy or been printed and provided for you. Please take as directed and contact our office if you believe you are having problem(s) with the medication(s) or have any questions.  Please stop by the lab on the basement level of the building for your blood work. Your results will be released to [place] (or called to you) after review, usually within 72 hours after test completion. If any changes need to be made, you will be notified at that same time.  Please stop by radiology on the basement level of the building for your x-rays. Your results will be released to [place] (or called to you) after review, usually within 72 hours after test completion. If any treatments or changes are necessary, you will be notified at that same time.  Referrals have been made during this visit. You should expect to hear back from our schedulers in about 7-10 days in regards to establishing an appointment with the specialists we discussed.   If your symptoms worsen or fail to improve, please contact our office for further instruction, or in case of emergency go directly to the emergency room at the closest medical facility.    Smoking Cessation Quitting smoking is important to your health and has many advantages. However, it is not always easy to quit since nicotine is a very addictive drug. Oftentimes, people try 3 times or more before being able to quit. This document explains the best ways for you to prepare to quit smoking. Quitting takes hard work and a lot of effort, but you can do it. ADVANTAGES OF QUITTING SMOKING  You will live longer, feel better, and live better.  Your body will feel the impact of quitting smoking almost immediately.  Within 20 minutes, blood pressure decreases. Your pulse returns to its normal level.  After 8 hours, carbon monoxide levels in the blood return to  normal. Your oxygen level increases.  After 24 hours, the chance of having a heart attack starts to decrease. Your breath, hair, and body stop smelling like smoke.  After 48 hours, damaged nerve endings begin to recover. Your sense of taste and smell improve.  After 72 hours, the body is virtually free of nicotine. Your bronchial tubes relax and breathing becomes easier.  After 2 to 12 weeks, lungs can hold more air. Exercise becomes easier and circulation improves.  The risk of having a heart attack, stroke, cancer, or lung disease is greatly reduced.  After 1 year, the risk of coronary heart disease is cut in half.  After 5 years, the risk of stroke falls to the same as a nonsmoker.  After 10 years, the risk of lung cancer is cut in half and the risk of other cancers decreases significantly.  After 15 years, the risk of coronary heart disease drops, usually to the level of a nonsmoker.  If you are pregnant, quitting smoking will improve your chances of having a healthy baby.  The people you live with, especially any children, will be healthier.  You will have extra money to spend on things other than cigarettes. QUESTIONS TO THINK ABOUT BEFORE ATTEMPTING TO QUIT You may want to talk about your answers with your health care provider.  Why do you want to quit?  If you tried to quit in the past, what helped and what did not?  What will be the most difficult situations for you after you quit? How will  you plan to handle them?  Who can help you through the tough times? Your family? Friends? A health care provider?  What pleasures do you get from smoking? What ways can you still get pleasure if you quit? Here are some questions to ask your health care provider:  How can you help me to be successful at quitting?  What medicine do you think would be best for me and how should I take it?  What should I do if I need more help?  What is smoking withdrawal like? How can I get  information on withdrawal? GET READY  Set a quit date.  Change your environment by getting rid of all cigarettes, ashtrays, matches, and lighters in your home, car, or work. Do not let people smoke in your home.  Review your past attempts to quit. Think about what worked and what did not. GET SUPPORT AND ENCOURAGEMENT You have a better chance of being successful if you have help. You can get support in many ways.  Tell your family, friends, and coworkers that you are going to quit and need their support. Ask them not to smoke around you.  Get individual, group, or telephone counseling and support. Programs are available at [employer] and health centers. Call your local health department for information about programs in your area.  Spiritual beliefs and practices may help some smokers quit.  Download a "quit meter" on your computer to keep track of quit statistics, such as how long you have gone without smoking, cigarettes not smoked, and money saved.  Get a self-help book about quitting smoking and staying off tobacco. [place] yourself from urges to smoke. Talk to someone, go for a walk, or occupy your time with a task.  Change your normal routine. Take a different route to work. Drink tea instead of coffee. Eat breakfast in a different place.  Reduce your stress. Take a hot bath, exercise, or read a book.  Plan something enjoyable to do every day. Reward yourself for not smoking.  Explore interactive web-based programs that specialize in helping you quit. GET MEDICINE AND USE IT CORRECTLY Medicines can help you stop smoking and decrease the urge to smoke. Combining medicine with the above behavioral methods and support can greatly increase your chances of successfully quitting smoking.  Nicotine replacement therapy helps deliver nicotine to your body without the negative effects and risks of smoking. Nicotine replacement therapy includes  nicotine gum, lozenges, inhalers, nasal sprays, and skin patches. Some may be available over-the-counter and others require a prescription.  Antidepressant medicine helps people abstain from smoking, but how this works is unknown. This medicine is available by prescription.  Nicotinic receptor partial agonist medicine simulates the effect of nicotine in your brain. This medicine is available by prescription. Ask your health care provider for advice about which medicines to use and how to use them based on your health history. Your health care provider will tell you what side effects to look out for if you choose to be on a medicine or therapy. Carefully read the information on the package. Do not use any other product containing nicotine while using a nicotine replacement product.  RELAPSE OR DIFFICULT SITUATIONS Most relapses occur within the first 3 months after quitting. Do not be discouraged if you start smoking again. Remember, most people try several times before finally quitting. You may have symptoms of withdrawal because your body is used to nicotine. You may crave cigarettes, be  irritable, feel very hungry, cough often, get headaches, or have difficulty concentrating. The withdrawal symptoms are only temporary. They are strongest when you first quit, but they will go away within 10-14 days. To reduce the chances of relapse, try to:  Avoid drinking alcohol. Drinking lowers your chances of successfully quitting.  Reduce the amount of caffeine you consume. Once you quit smoking, the amount of caffeine in your body increases and can give you symptoms, such as a rapid heartbeat, sweating, and anxiety.  Avoid smokers because they can make you want to smoke.  Do not let weight gain distract you. Many smokers will gain weight when they quit, usually less than 10 pounds. Eat a healthy diet and stay active. You can always lose the weight gained after you quit.  Find ways to improve your mood other  than smoking. FOR MORE INFORMATION  www.smokefree.gov  Document Released: 02/22/2001 Document Revised: 07/15/2013 Document Reviewed: 06/09/2011 [facility] Patient Information 2015 [place], [place]. This information is not intended to replace advice given to you by your health care provider. Make sure you discuss any questions you have with your health care provider.  Bupropion sustained-release tablets (smoking cessation) What is this medicine? BUPROPION (byoo PROE pee on) is used to help people quit smoking. This medicine may be used for other purposes; ask your health care provider or pharmacist if you have questions. COMMON BRAND NAME(S): Buproban, Zyban What should I tell my health care provider before I take this medicine? They need to know if you have any of these conditions: -an eating disorder, such as anorexia or bulimia -bipolar disorder or psychosis -diabetes or high blood sugar, treated with medication -glaucoma -head injury or brain tumor -heart disease, previous heart attack, or irregular heart beat -high blood pressure -kidney or liver disease -seizures -suicidal thoughts or a previous suicide attempt -Tourette's syndrome -weight loss -an unusual or allergic reaction to bupropion, other medicines, foods, dyes, or preservatives -breast-feeding -pregnant or trying to become pregnant How should I use this medicine? Take this medicine by mouth with a glass of water. Follow the directions on the prescription label. You can take it with or without food. If it upsets your stomach, take it with food. Do not cut, crush or chew this medicine. Take your medicine at regular intervals. If you take this medicine more than once a day, take your second dose at least 8 hours after you take your first dose. To limit difficulty in sleeping, avoid taking this medicine at bedtime. Do not take your medicine more often than directed. Do not stop taking this medicine suddenly except upon the advice  of your doctor. Stopping this medicine too quickly may cause serious side effects. A special MedGuide will be given to you by the pharmacist with each prescription and refill. Be sure to read this information carefully each time. Talk to your pediatrician regarding the use of this medicine in children. Special care may be needed. Overdosage: If you think you have taken too much of this medicine contact a poison control center or emergency room at once. NOTE: This medicine is only for you. Do not share this medicine with others. What if I miss a dose? If you miss a dose, skip the missed dose and take your next tablet at the regular time. There should be at least 8 hours between doses. Do not take double or extra doses. What may interact with this medicine? Do not take this medicine with any of the following medications: -linezolid -MAOIs like  Azilect, Carbex, Eldepryl, Marplan, Nardil, and Parnate -methylene blue (injected into a vein) -other medicines that contain bupropion like Wellbutrin This medicine may also interact with the following medications: -alcohol -certain medicines for anxiety or sleep -certain medicines for blood pressure like metoprolol, propranolol -certain medicines for depression or psychotic disturbances -certain medicines for HIV or AIDS like efavirenz, lopinavir, nelfinavir, ritonavir -certain medicines for irregular heart beat like propafenone, flecainide -certain medicines for Parkinson's disease like amantadine, levodopa -certain medicines for seizures like carbamazepine, phenytoin, phenobarbital -cimetidine -clopidogrel -cyclophosphamide -furazolidone -isoniazid -nicotine -orphenadrine -procarbazine -steroid medicines like prednisone or cortisone -stimulant medicines for attention disorders, weight loss, or to stay awake -tamoxifen -theophylline -thiotepa -ticlopidine -tramadol -warfarin This list may not describe all possible interactions. Give your  health care provider a list of all the medicines, herbs, non-prescription drugs, or dietary supplements you use. Also tell them if you smoke, drink alcohol, or use illegal drugs. Some items may interact with your medicine. What should I watch for while using this medicine? Visit your doctor or health care professional for regular checks on your progress. This medicine should be used together with a patient support program. It is important to participate in a behavioral program, counseling, or other support program that is recommended by your health care professional. Patients and their families should watch out for new or worsening thoughts of suicide or depression. Also watch out for sudden changes in feelings such as feeling anxious, agitated, panicky, irritable, hostile, aggressive, impulsive, severely restless, overly excited and hyperactive, or not being able to sleep. If this happens, especially at the beginning of treatment or after a change in dose, call your health care professional. Avoid alcoholic drinks while taking this medicine. Drinking excessive alcoholic beverages, using sleeping or anxiety medicines, or quickly stopping the use of these agents while taking this medicine may increase your risk for a seizure. Do not drive or use heavy machinery until you know how this medicine affects you. This medicine can impair your ability to perform these tasks. Do not take this medicine close to bedtime. It may prevent you from sleeping. Your mouth may get dry. Chewing sugarless gum or sucking hard candy, and drinking plenty of water may help. Contact your doctor if the problem does not go away or is severe. Do not use nicotine patches or chewing gum without the advice of your doctor or health care professional while taking this medicine. You may need to have your blood pressure taken regularly if your doctor recommends that you use both nicotine and this medicine together. What side effects may I  notice from receiving this medicine? Side effects that you should report to your doctor or health care professional as soon as possible: -allergic reactions like skin rash, itching or hives, swelling of the face, lips, or tongue -breathing problems -changes in vision -confusion -fast or irregular heartbeat -hallucinations -increased blood pressure -redness, blistering, peeling or loosening of the skin, including inside the mouth -seizures -suicidal thoughts or other mood changes -unusually weak or tired -vomiting Side effects that usually do not require medical attention (report to your doctor or health care professional if they continue or are bothersome): -change in sex drive or performance -constipation -headache -loss of appetite -nausea -tremors -weight loss This list may not describe all possible side effects. Call your doctor for medical advice about side effects. You may report side effects to FDA at 1-800-FDA-1088. Where should I keep my medicine? Keep out of the reach of children. Store at room temperature  between 20 and 25 degrees C (68 and 77 degrees F). Protect from light. Keep container tightly closed. Throw away any unused medicine after the expiration date. NOTE: This sheet is a summary. It may not cover all possible information. If you have questions about this medicine, talk to your doctor, pharmacist, or health care provider.  2015, Elsevier/Gold Standard. (2012-10-26 10:55:10)  Varenicline oral tablets What is this medicine? VARENICLINE (var EN i kleen) is used to help people quit smoking. It can reduce the symptoms caused by stopping smoking. It is used with a patient support program recommended by your physician. This medicine may be used for other purposes; ask your health care provider or pharmacist if you have questions. COMMON BRAND NAME(S): Chantix What should I tell my health care provider before I take this medicine? They need to know if you have any  of these conditions: -bipolar disorder, depression, schizophrenia or other mental illness -heart disease -if you often drink alcohol -kidney disease -peripheral vascular disease -seizures -stroke -suicidal thoughts, plans, or attempt; a previous suicide attempt by you or a family member -an unusual or allergic reaction to varenicline, other medicines, foods, dyes, or preservatives -pregnant or trying to get pregnant -breast-feeding How should I use this medicine? You should set a date to stop smoking and tell your doctor. Start this medicine one week before the quit date. You can also start taking this medicine before you choose a quit date, and then pick a quit date that is between 8 and 35 days of treatment with this medicine. Stick to your plan; ask about support groups or other ways to help you remain a 'quitter'. Take this medicine by mouth after eating. Take with a full glass of water. Follow the directions on the prescription label. Take your doses at regular intervals. Do not take your medicine more often than directed. A special MedGuide will be given to you by the pharmacist with each prescription and refill. Be sure to read this information carefully each time. Talk to your pediatrician regarding the use of this medicine in children. This medicine is not approved for use in children. Overdosage: If you think you have taken too much of this medicine contact a poison control center or emergency room at once. NOTE: This medicine is only for you. Do not share this medicine with others. What if I miss a dose? If you miss a dose, take it as soon as you can. If it is almost time for your next dose, take only that dose. Do not take double or extra doses. What may interact with this medicine? -alcohol or any product that contains alcohol -insulin -other stop smoking aids -theophylline -warfarin This list may not describe all possible interactions. Give your health care provider a list of  all the medicines, herbs, non-prescription drugs, or dietary supplements you use. Also tell them if you smoke, drink alcohol, or use illegal drugs. Some items may interact with your medicine. What should I watch for while using this medicine? Visit your doctor or health care professional for regular check ups. Ask for ongoing advice and encouragement from your doctor or healthcare professional, friends, and family to help you quit. If you smoke while on this medication, quit again Your mouth may get dry. Chewing sugarless gum or sucking hard candy, and drinking plenty of water may help. Contact your doctor if the problem does not go away or is severe. You may get drowsy or dizzy. Do not drive, use machinery, or do anything  that needs mental alertness until you know how this medicine affects you. Do not stand or sit up quickly, especially if you are an older patient. This reduces the risk of dizzy or fainting spells. The use of this medicine may increase the chance of suicidal thoughts or actions. Pay special attention to how you are responding while on this medicine. Any worsening of mood, or thoughts of suicide or dying should be reported to your health care professional right away. What side effects may I notice from receiving this medicine? Side effects that you should report to your doctor or health care professional as soon as possible: -allergic reactions like skin rash, itching or hives, swelling of the face, lips, tongue, or throat -breathing problems -changes in vision -chest pain or chest tightness -confusion, trouble speaking or understanding -fast, irregular heartbeat -feeling faint or lightheaded, falls -fever -pain in legs when walking -problems with balance, talking, walking -redness, blistering, peeling or loosening of the skin, including inside the mouth -ringing in ears -seizures -sudden numbness or weakness of the face, arm or leg -suicidal thoughts or other mood  changes -trouble passing urine or change in the amount of urine -unusual bleeding or bruising -unusually weak or tired Side effects that usually do not require medical attention (report to your doctor or health care professional if they continue or are bothersome): -constipation -headache -nausea, vomiting -strange dreams -stomach gas -trouble sleeping This list may not describe all possible side effects. Call your doctor for medical advice about side effects. You may report side effects to FDA at 1-800-FDA-1088. Where should I keep my medicine? Keep out of the reach of children. Store at room temperature between 15 and 30 degrees C (59 and 86 degrees F). Throw away any unused medicine after the expiration date. NOTE: This sheet is a summary. It may not cover all possible information. If you have questions about this medicine, talk to your doctor, pharmacist, or health care provider.  2015, [employer]vier/Gold Standard. (2012-12-10 13:37:47)

## 2014-05-08 NOTE — Assessment & Plan Note (Signed)
Diabetes remains uncontrolled per patient. Obtain A1c, BMET and urine microalbumen. Foot exam completed today. Meter provided to patient Systems developer). Hold off on check blood sugars until A1c returns. Follow up pending A1c value.

## 2014-05-08 NOTE — Progress Notes (Signed)
Pre visit review using our clinic review tool, if applicable. No additional management support is needed unless otherwise documented below in the visit note. 

## 2014-05-08 NOTE — Progress Notes (Signed)
Subjective:    Patient ID: Cindy Curry, female    DOB: Nov 15, 1975, 39 y.o.   MRN: [id number]  Chief Complaint  Patient presents with  . Establish Care  . Hypertension  . Diabetes    HPI:  Cindy Curry is a 39 y.o. female who presents today to establish care and discuss diabetes and hypertension  1) Diabetes - Previously diagnosed with diabetes at the age of 68. Reports her last A1c was less than 7 about 3 years ago. Last eye exam was 3 years ago. Not currently maintained on any medication   2) Hypertension - Previously diagnosed with hypertension; Currently taking lisinopril which was recently increased by her GYN.   BP Readings from Last 3 Encounters:  05/08/14 190/100  06/12/13 184/77  08/23/11 185/97    3) Tobacco use - Currently smoking about 1/2 pack per day and is interested in quitting.    No Known Allergies   Current Outpatient Prescriptions on File Prior to Visit  Medication Sig Dispense Refill  . ibuprofen (ADVIL,MOTRIN) 800 MG tablet Take 1 tablet (800 mg total) by mouth 3 (three) times daily. (Patient not taking: Reported on 05/08/2014) 21 tablet 0   No current facility-administered medications on file prior to visit.    Past Medical History  Diagnosis Date  . Diabetes mellitus     type 2. no meds  . Anxiety   . Depression   . Generalized headaches   . Hypertension     Past Surgical History  Procedure Laterality Date  . Tubal ligation  2010    Family History  Problem Relation Age of Onset  . Hypertension Mother   . Hypertension Father   . Hypertension Maternal Grandmother   . Diabetes Maternal Grandmother   . Hypertension Paternal Grandmother   . Hypertension Paternal Grandfather     History   Social History  . Marital Status: Single    Spouse Name: N/A  . Number of Children: 3  . Years of Education: 15   Occupational History  . Medical billing    Social History Main Topics  . Smoking status: Current Every Day Smoker --  0.50 packs/day for 10 years    Types: Cigarettes  . Smokeless tobacco: Never Used  . Alcohol Use: 0.6 oz/week    1 Glasses of wine per week     Comment: occasional  . Drug Use: No  . Sexual Activity: Yes    Birth Control/ Protection: Surgical, Condom   Other Topics Concern  . Not on file   Social History Narrative   Born and raised in [place]. Currently resides in a house with her 3 children. 1 dog. Fun: Movies, Customer service manager;   Denies any religious beliefs effecting healthcare.     Review of Systems  Eyes:       Denies changes in vision  Respiratory: Negative for chest tightness and shortness of breath.   Cardiovascular: Negative for chest pain.  Endocrine: Negative for polydipsia, polyphagia and polyuria.  Neurological: Positive for numbness (Hands and feet occasionally).      Objective:    BP 190/100 mmHg  Pulse 98  Temp(Src) 98.3 F (36.8 C) (Oral)  Ht 5\' 7"  (1.702 m)  Wt 224 lb 4 oz (101.719 kg)  BMI 35.11 kg/m2  SpO2 97%  LMP 04/16/2014 Nursing note and vital signs reviewed.  Physical Exam  Constitutional: She is oriented to person, place, and time. She appears well-developed and well-nourished. No distress.  Cardiovascular: Normal rate, regular rhythm,  normal heart sounds and intact distal pulses.   Pulmonary/Chest: Effort normal and breath sounds normal.  Neurological: She is alert and oriented to person, place, and time.  Skin: Skin is warm and dry.  Psychiatric: She has a normal mood and affect. Her behavior is normal. Judgment and thought content normal.       Assessment & Plan:

## 2014-05-09 ENCOUNTER — Encounter: Payer: Self-pay | Admitting: Family

## 2015-05-14 ENCOUNTER — Emergency Department (HOSPITAL_COMMUNITY)
Admission: EM | Admit: 2015-05-14 | Discharge: 2015-05-14 | Disposition: A | Discharge status: Home / Self Care | Payer: Self-pay | Attending: Emergency Medicine | Admitting: Emergency Medicine

## 2015-05-14 ENCOUNTER — Encounter (HOSPITAL_COMMUNITY): Payer: Self-pay | Admitting: Emergency Medicine

## 2015-05-14 ENCOUNTER — Emergency Department (HOSPITAL_COMMUNITY): Payer: Self-pay

## 2015-05-14 DIAGNOSIS — R05 Cough: Secondary | ICD-10-CM | POA: Insufficient documentation

## 2015-05-14 DIAGNOSIS — Z8659 Personal history of other mental and behavioral disorders: Secondary | ICD-10-CM | POA: Insufficient documentation

## 2015-05-14 DIAGNOSIS — R509 Fever, unspecified: Secondary | ICD-10-CM | POA: Insufficient documentation

## 2015-05-14 DIAGNOSIS — R000 Tachycardia, unspecified: Secondary | ICD-10-CM | POA: Insufficient documentation

## 2015-05-14 DIAGNOSIS — E119 Type 2 diabetes mellitus without complications: Secondary | ICD-10-CM | POA: Insufficient documentation

## 2015-05-14 DIAGNOSIS — I10 Essential (primary) hypertension: Secondary | ICD-10-CM | POA: Insufficient documentation

## 2015-05-14 DIAGNOSIS — R51 Headache: Secondary | ICD-10-CM | POA: Insufficient documentation

## 2015-05-14 DIAGNOSIS — R6889 Other general symptoms and signs: Secondary | ICD-10-CM

## 2015-05-14 DIAGNOSIS — F17210 Nicotine dependence, cigarettes, uncomplicated: Secondary | ICD-10-CM | POA: Insufficient documentation

## 2015-05-14 DIAGNOSIS — J029 Acute pharyngitis, unspecified: Secondary | ICD-10-CM | POA: Insufficient documentation

## 2015-05-14 DIAGNOSIS — R112 Nausea with vomiting, unspecified: Secondary | ICD-10-CM | POA: Insufficient documentation

## 2015-05-14 LAB — COMPREHENSIVE METABOLIC PANEL
ALBUMIN: 4.3 g/dL (ref 3.5–5.0)
ALT: 16 U/L (ref 14–54)
ANION GAP: 8 (ref 5–15)
AST: 19 U/L (ref 15–41)
Alkaline Phosphatase: 77 U/L (ref 38–126)
BUN: 10 mg/dL (ref 6–20)
CALCIUM: 8.7 mg/dL — AB (ref 8.9–10.3)
CHLORIDE: 107 mmol/L (ref 101–111)
CO2: 24 mmol/L (ref 22–32)
Creatinine, Ser: 0.78 mg/dL (ref 0.44–1.00)
GFR calc Af Amer: >60 mL/min (ref >60)
GFR calc non Af Amer: >60 mL/min (ref >60)
GLUCOSE: 121 mg/dL — AB (ref 65–99)
POTASSIUM: 3.7 mmol/L (ref 3.5–5.1)
SODIUM: 139 mmol/L (ref 135–145)
Total Bilirubin: 0.4 mg/dL (ref 0.3–1.2)
Total Protein: 7.9 g/dL (ref 6.5–8.1)

## 2015-05-14 LAB — CBC
HEMATOCRIT: 39.5 % (ref 36.0–46.0)
HEMOGLOBIN: 13.2 g/dL (ref 12.0–15.0)
MCH: 30.4 pg (ref 26.0–34.0)
MCHC: 33.4 g/dL (ref 30.0–36.0)
MCV: 91 fL (ref 78.0–100.0)
Platelets: 290 10*3/uL (ref 150–400)
RBC: 4.34 MIL/uL (ref 3.87–5.11)
RDW: 13 % (ref 11.5–15.5)
WBC: 7.7 10*3/uL (ref 4.0–10.5)

## 2015-05-14 LAB — I-STAT BETA HCG BLOOD, ED (MC, WL, AP ONLY)

## 2015-05-14 LAB — LIPASE, BLOOD: LIPASE: 22 U/L (ref 11–51)

## 2015-05-14 MED ORDER — KETOROLAC TROMETHAMINE 15 MG/ML IJ SOLN
15.0000 mg | Freq: Once | INTRAMUSCULAR | Status: AC
  Administered 2015-05-14: 15 mg via INTRAVENOUS
  Filled 2015-05-14: qty 1

## 2015-05-14 MED ORDER — ONDANSETRON HCL 4 MG/2ML IJ SOLN
4.0000 mg | Freq: Once | INTRAMUSCULAR | Status: AC
  Administered 2015-05-14: 4 mg via INTRAVENOUS
  Filled 2015-05-14: qty 2

## 2015-05-14 MED ORDER — SODIUM CHLORIDE 0.9 % IV BOLUS (SEPSIS)
1000.0000 mL | Freq: Once | INTRAVENOUS | Status: AC
  Administered 2015-05-14: 1000 mL via INTRAVENOUS

## 2015-05-14 NOTE — ED Notes (Addendum)
Pt c/o headache, emesis, rhinorrhea, sensation of fever, chills, abdominal pain. Pt adds her family member was recently diagnosed with flu.

## 2015-05-14 NOTE — ED Notes (Signed)
Discharged with family.

## 2015-05-14 NOTE — ED Notes (Signed)
Pt states she is unable to urinate at this time.

## 2015-05-14 NOTE — ED Notes (Signed)
Patient transported to X-ray 

## 2015-05-14 NOTE — ED Notes (Signed)
MD at bedside. 

## 2015-05-18 NOTE — ED Provider Notes (Signed)
CSN: [id number]     Arrival date & time 05/14/15  0815 History   First MD Initiated Contact with Patient 05/14/15 [phone]     Chief Complaint  Patient presents with  . Emesis     (Consider location/radiation/quality/duration/timing/severity/associated sxs/prior Treatment) HPI    40 year old female with fever, headache, nausea vomiting, body aches and sore throat. Symptom onset about a day and a half ago. Persistent progressive since then. Family members recently with similar, flulike symptoms. Nonproductive cough. Denies any dyspnea. No rash. No unusual swelling. Has been taking a over-the-counter cold medications without significant relief. No urinary complaints.   Past Medical History  Diagnosis Date  . Diabetes mellitus     type 2. no meds  . Anxiety   . Depression   . Generalized headaches   . Hypertension    Past Surgical History  Procedure Laterality Date  . Tubal ligation  2010   Family History  Problem Relation Age of Onset  . Hypertension Mother   . Hypertension Father   . Hypertension Maternal Grandmother   . Diabetes Maternal Grandmother   . Hypertension Paternal Grandmother   . Hypertension Paternal Grandfather    Social History  Substance Use Topics  . Smoking status: Current Every Day Smoker -- 0.50 packs/day for 10 years    Types: Cigarettes  . Smokeless tobacco: Never Used  . Alcohol Use: 0.6 oz/week    1 Glasses of wine per week     Comment: occasional   OB History    Gravida Para Term Preterm AB TAB SAB Ectopic Multiple Living   3 3 3  0 0 0 0 0 0 3     Review of Systems  All systems reviewed and negative, other than as noted in HPI.   Allergies  Review of patient's allergies indicates no known allergies.  Home Medications   Prior to Admission medications   Medication Sig Start Date End Date Taking? Authorizing Provider  DM-Phenylephrine-Acetaminophen (VICKS DAYQUIL COLD & FLU) 10-5-325 MG CAPS Take 1 capsule by mouth once.   Yes Historical  Provider, MD  chlorthalidone (HYGROTON) 25 MG tablet Take 1 tablet (25 mg total) by mouth daily. Patient not taking: Reported on 05/14/2015 05/08/14   Golden Circle, FNP  lisinopril (PRINIVIL,ZESTRIL) 20 MG tablet Take 1 tablet (20 mg total) by mouth daily. Patient not taking: Reported on 05/14/2015 05/08/14   Golden Circle, FNP   BP 165/91 mmHg  Pulse 117  Temp(Src) 99.4 F (37.4 C) (Oral)  Resp 16  Ht 5\' 7"  (1.702 m)  Wt 205 lb (92.987 kg)  BMI 32.10 kg/m2  SpO2 100%  LMP 05/14/2015 Physical Exam  Constitutional: She appears well-developed and well-nourished. No distress.  Laying in bed. Appears tired, but not toxic.  HENT:  Head: Normocephalic and atraumatic.  Eyes: Conjunctivae are normal. Right eye exhibits no discharge. Left eye exhibits no discharge.  Neck: Neck supple.  Cardiovascular: Regular rhythm and normal heart sounds.  Exam reveals no gallop and no friction rub.   No murmur heard. Tachycardia  Pulmonary/Chest: Effort normal and breath sounds normal. No respiratory distress.  Abdominal: Soft. She exhibits no distension. There is no tenderness.  Musculoskeletal: She exhibits no edema or tenderness.  Neurological: She is alert.  Skin: Skin is warm and dry.  Psychiatric: She has a normal mood and affect. Her behavior is normal. Thought content normal.  Nursing note and vitals reviewed.   ED Course  Procedures (including critical care time) Labs Review Labs Reviewed  COMPREHENSIVE METABOLIC PANEL - Abnormal; Notable for the following:    Glucose, Bld 121 (*)    Calcium 8.7 (*)    All other components within normal limits  LIPASE, BLOOD  CBC  I-STAT BETA HCG BLOOD, ED ([facility], [facility], [facility] ONLY)    Imaging Review No results found. I have personally reviewed and evaluated these images and lab results as part of my medical decision-making.   EKG Interpretation None      MDM   Final diagnoses:  Flu-like symptoms    40 year old female with flulike symptoms.  Aside from tachycardia per vitals are otherwise stable. Workup unremarkable. Low suspicion for emergent process. Plan symptomatic treatment (plenty rest, push fluids, as needed NSAIDs, etc.). It has been determined that no acute conditions requiring further emergency intervention are present at this time. The patient has been advised of the diagnosis and plan. I reviewed any labs and imaging including any potential incidental findings. We have discussed signs and symptoms that warrant return to the ED and they are listed in the discharge instructions.      Virgel Manifold, MD 05/18/15 [phone]

## 2016-01-25 ENCOUNTER — Emergency Department (HOSPITAL_COMMUNITY)
Admission: EM | Admit: 2016-01-25 | Discharge: 2016-01-25 | Disposition: A | Discharge status: Home / Self Care | Payer: PRIVATE HEALTH INSURANCE | Attending: Emergency Medicine | Admitting: Emergency Medicine

## 2016-01-25 ENCOUNTER — Encounter (HOSPITAL_COMMUNITY): Payer: Self-pay | Admitting: *Deleted

## 2016-01-25 ENCOUNTER — Emergency Department (HOSPITAL_COMMUNITY): Payer: PRIVATE HEALTH INSURANCE

## 2016-01-25 DIAGNOSIS — R42 Dizziness and giddiness: Secondary | ICD-10-CM | POA: Diagnosis not present

## 2016-01-25 DIAGNOSIS — E119 Type 2 diabetes mellitus without complications: Secondary | ICD-10-CM | POA: Insufficient documentation

## 2016-01-25 DIAGNOSIS — R55 Syncope and collapse: Secondary | ICD-10-CM

## 2016-01-25 DIAGNOSIS — F17210 Nicotine dependence, cigarettes, uncomplicated: Secondary | ICD-10-CM | POA: Insufficient documentation

## 2016-01-25 DIAGNOSIS — I10 Essential (primary) hypertension: Secondary | ICD-10-CM | POA: Insufficient documentation

## 2016-01-25 LAB — COMPREHENSIVE METABOLIC PANEL
ALBUMIN: 4.2 g/dL (ref 3.5–5.0)
ALK PHOS: 75 U/L (ref 38–126)
ALT: 16 U/L (ref 14–54)
ANION GAP: 6 (ref 5–15)
AST: 20 U/L (ref 15–41)
BILIRUBIN TOTAL: 0.5 mg/dL (ref 0.3–1.2)
BUN: 11 mg/dL (ref 6–20)
CALCIUM: 8.7 mg/dL — AB (ref 8.9–10.3)
CO2: 23 mmol/L (ref 22–32)
Chloride: 110 mmol/L (ref 101–111)
Creatinine, Ser: 0.73 mg/dL (ref 0.44–1.00)
GFR calc Af Amer: >60 mL/min (ref >60)
GLUCOSE: 114 mg/dL — AB (ref 65–99)
Potassium: 3.2 mmol/L — ABNORMAL LOW (ref 3.5–5.1)
Sodium: 139 mmol/L (ref 135–145)
TOTAL PROTEIN: 7.6 g/dL (ref 6.5–8.1)

## 2016-01-25 LAB — URINALYSIS, ROUTINE W REFLEX MICROSCOPIC
BILIRUBIN URINE: NEGATIVE
Glucose, UA: NEGATIVE mg/dL
KETONES UR: NEGATIVE mg/dL
NITRITE: NEGATIVE
Protein, ur: NEGATIVE mg/dL
SPECIFIC GRAVITY, URINE: 1.012 (ref 1.005–1.030)
pH: 7.5 (ref 5.0–8.0)

## 2016-01-25 LAB — CBC
HCT: 36.2 % (ref 36.0–46.0)
HEMOGLOBIN: 12.7 g/dL (ref 12.0–15.0)
MCH: 30.5 pg (ref 26.0–34.0)
MCHC: 35.1 g/dL (ref 30.0–36.0)
MCV: 86.8 fL (ref 78.0–100.0)
Platelets: 292 10*3/uL (ref 150–400)
RBC: 4.17 MIL/uL (ref 3.87–5.11)
RDW: 12.6 % (ref 11.5–15.5)
WBC: 7.5 10*3/uL (ref 4.0–10.5)

## 2016-01-25 LAB — URINE MICROSCOPIC-ADD ON

## 2016-01-25 LAB — CBG MONITORING, ED: GLUCOSE-CAPILLARY: 109 mg/dL — AB (ref 65–99)

## 2016-01-25 LAB — I-STAT BETA HCG BLOOD, ED (MC, WL, AP ONLY)

## 2016-01-25 LAB — I-STAT TROPONIN, ED: Troponin i, poc: 0 ng/mL (ref 0.00–0.08)

## 2016-01-25 MED ORDER — SODIUM CHLORIDE 0.9 % IV BOLUS (SEPSIS)
1000.0000 mL | Freq: Once | INTRAVENOUS | Status: AC
  Administered 2016-01-25: 1000 mL via INTRAVENOUS

## 2016-01-25 MED ORDER — ACETAMINOPHEN 325 MG PO TABS
650.0000 mg | ORAL_TABLET | Freq: Once | ORAL | Status: AC
  Administered 2016-01-25: 650 mg via ORAL
  Filled 2016-01-25: qty 2

## 2016-01-25 MED ORDER — POTASSIUM CHLORIDE CRYS ER 20 MEQ PO TBCR
40.0000 meq | EXTENDED_RELEASE_TABLET | Freq: Once | ORAL | Status: AC
  Administered 2016-01-25: 40 meq via ORAL
  Filled 2016-01-25: qty 2

## 2016-01-25 NOTE — ED Triage Notes (Signed)
Per EMS, pt was sitting in a meeting at work when she had sudden onset of dizziness and vomiting. Pt did not experience LOC.  Coworker called EMS.    Pt has 20G in LAC, has not received any nausea meds.  BP: 176/102 HR: 113 CBG: 124 12 lead showed NSR

## 2016-01-25 NOTE — ED Provider Notes (Signed)
[facility] DEPT Provider Note   CSN: [id number] Arrival date & time: 01/25/16  1120     History   Chief Complaint Chief Complaint  Patient presents with  . Dizziness  . Emesis    HPI Cindy Curry is a 40 y.o. female.  HPI Cindy Curry is a 41 y.o. female with PMH significant for anxiety, depression, DM (not on medication), and HTN who presents with near syncope.  Patient states she was sitting in a meeting at work at 10 AM this morning when she suddenly became very hot, diaphoretic, and became short of breath.  She states she got up and went to the rest room and felt very dizzy she describes as "light headed and like faint".  No vertiginous symptoms.  She had 2 episodes of vomiting and nausea.  EMS was called at that time.  She states she feels close to baseline now and complaining of a mild headache and intermittent nausea.  This has never happened before.  Denies personal cardiac disease.  No early family history of CAD.  No family or personal history of DVT/PE.  No recent surgery, immobilization, unilateral leg swelling, or exogenous estrogen.  She states she had not eaten this morning, CBG with EMS 120.  She denies any fever, cough, abdominal pain, diarrhea.  LMP 01/25/16.  Past Medical History:  Diagnosis Date  . Anxiety   . Depression   . Diabetes mellitus    type 2. no meds  . Generalized headaches   . Hypertension     Patient Active Problem List   Diagnosis Date Noted  . Type II diabetes mellitus, uncontrolled ([facility]) 05/08/2014  . Essential hypertension 05/08/2014  . Tobacco use disorder 05/08/2014    Past Surgical History:  Procedure Laterality Date  . TUBAL LIGATION  2010    OB History    Gravida Para Term Preterm AB Living   3 3 3  0 0 3   SAB TAB Ectopic Multiple Live Births   0 0 0 0         Home Medications    Prior to Admission medications   Medication Sig Start Date End Date Taking? Authorizing Provider  acetaminophen (TYLENOL) 500 MG  tablet Take 1,000 mg by mouth every 6 (six) hours as needed (PAIN).   Yes Historical Provider, MD  amLODipine (NORVASC) 10 MG tablet Take 10 mg by mouth at bedtime. 01/23/16  Yes Historical Provider, MD  telmisartan-hydrochlorothiazide (MICARDIS HCT) 80-12.5 MG tablet Take 1 tablet by mouth daily with breakfast. 12/23/15  Yes Historical Provider, MD  chlorthalidone (HYGROTON) 25 MG tablet Take 1 tablet (25 mg total) by mouth daily. Patient not taking: Reported on 05/14/2015 05/08/14   Golden Circle, FNP  lisinopril (PRINIVIL,ZESTRIL) 20 MG tablet Take 1 tablet (20 mg total) by mouth daily. Patient not taking: Reported on 05/14/2015 05/08/14   Golden Circle, FNP    Family History Family History  Problem Relation Age of Onset  . Hypertension Mother   . Hypertension Father   . Hypertension Maternal Grandmother   . Diabetes Maternal Grandmother   . Hypertension Paternal Grandmother   . Hypertension Paternal Grandfather     Social History Social History  Substance Use Topics  . Smoking status: Current Every Day Smoker    Packs/day: 0.50    Years: 10.00    Types: Cigarettes  . Smokeless tobacco: Never Used  . Alcohol use 0.6 oz/week    1 Glasses of wine per week     Comment:  occasional     Allergies   Patient has no known allergies.   Review of Systems Review of Systems All other systems negative unless otherwise stated in HPI   Physical Exam Updated Vital Signs BP (!) 164/109   Pulse 90   Temp 98.6 F (37 C) (Oral)   Resp 18   LMP 01/25/2016   SpO2 100%   Physical Exam  Constitutional: She is oriented to person, place, and time. She appears well-developed and well-nourished.  Non-toxic appearance. She does not have a sickly appearance. She does not appear ill.  HENT:  Head: Normocephalic and atraumatic.  Mouth/Throat: Oropharynx is clear and moist.  Eyes: Conjunctivae and EOM are normal. Pupils are equal, round, and reactive to light.  Neck: Normal range of  motion. Neck supple.  Cardiovascular: Normal rate and regular rhythm.   Lower extremities symmetric.   Pulmonary/Chest: Effort normal and breath sounds normal. No accessory muscle usage or stridor. No respiratory distress. She has no wheezes. She has no rhonchi. She has no rales.  Abdominal: Soft. Bowel sounds are normal. She exhibits no distension. There is no tenderness.  Musculoskeletal: Normal range of motion.  Lymphadenopathy:    She has no cervical adenopathy.  Neurological: She is alert and oriented to person, place, and time.  Mental Status:   AOx3.  Speech clear without dysarthria. Cranial Nerves:  I-not tested  II-PERRLA  III, IV, VI-EOMs intact  V-temporal and masseter strength intact  VII-symmetrical facial movements intact, no facial droop  VIII-hearing grossly intact bilaterally  IX, X-gag intact  XI-strength of sternomastoid and trapezius muscles 5/5  XII-tongue midline Motor:   Good muscle bulk and tone  Strength 5/5 bilaterally in upper and lower extremities   Cerebellar--intact RAMs, finger to nose intact bilaterally.  Gait normal  No pronator drift Sensory:  Intact in upper and lower extremities   Skin: Skin is warm and dry.  Psychiatric: She has a normal mood and affect. Her behavior is normal.     ED Treatments / Results  Labs (all labs ordered are listed, but only abnormal results are displayed) Labs Reviewed  URINALYSIS, ROUTINE W REFLEX MICROSCOPIC (NOT AT [facility]) - Abnormal; Notable for the following:       Result Value   APPearance CLOUDY (*)    Hgb urine dipstick LARGE (*)    Leukocytes, UA TRACE (*)    All other components within normal limits  COMPREHENSIVE METABOLIC PANEL - Abnormal; Notable for the following:    Potassium 3.2 (*)    Glucose, Bld 114 (*)    Calcium 8.7 (*)    All other components within normal limits  URINE MICROSCOPIC-ADD ON - Abnormal; Notable for the following:    Squamous Epithelial / LPF 0-5 (*)    Bacteria, UA FEW  (*)    All other components within normal limits  CBG MONITORING, ED - Abnormal; Notable for the following:    Glucose-Capillary 109 (*)    All other components within normal limits  CBC  I-STAT BETA HCG BLOOD, ED ([facility], [facility], [facility] ONLY)  I-STAT TROPOININ, ED    EKG  EKG Interpretation None       Radiology Dg Chest 2 View  Result Date: 01/25/2016 CLINICAL DATA:  Dizziness, vomiting, short of breath today. EXAM: CHEST  2 VIEW COMPARISON:  05/14/2015 FINDINGS: Normal heart size. Lungs clear. No pneumothorax. No pleural effusion. IMPRESSION: No active cardiopulmonary disease. Electronically Signed   By: Marybelle Killings M.D.   On: 01/25/2016 12:25  Procedures Procedures (including critical care time)  Medications Ordered in ED Medications  acetaminophen (TYLENOL) tablet 650 mg (650 mg Oral Given 01/25/16 1254)  sodium chloride 0.9 % bolus 1,000 mL (1,000 mLs Intravenous New Bag/Given 01/25/16 1256)  potassium chloride SA (K-DUR,KLOR-CON) CR tablet 40 mEq (40 mEq Oral Given 01/25/16 1254)     Initial Impression / Assessment and Plan / ED Course  I have reviewed the triage vital signs and the nursing notes.  Pertinent labs & imaging results that were available during my care of the patient were reviewed by me and considered in my medical decision making (see chart for details).  Clinical Course    Patient presents with near syncope.  Feeling better now.  Normal neurological exam without focal deficits. Heart sounds normal.  Lungs CTAB.  Will initiate syncope work up.  Low suspicion for central neurologic process.  Low suspicion PE, low risk Well's criteria, PERC negative.  Vasovagal vs metabolic derangement vs arrhythmia vs orthostatic hypotension.  Mild hypokalemia, repleted in ED.  Otherwise, labs without acute abnormalities.  EKG without arrhythmia or ischemia.  CXR clear.  Patient ran out of her combination BP medicine 3 days ago and has been out of that.  Possible sudden elevation  of BP caused this.  She contacted her PCP while in the ED and will receive 21 day supply of samples and then refills.  Return precaution discussed.  Stable for discharge.    Case has been discussed with and seen by Dr. Ralene Bathe who agrees with the above plan for discharge.  Final Clinical Impressions(s) / ED Diagnoses   Final diagnoses:  Dizziness  Near syncope    New Prescriptions New Prescriptions   No medications on file     Gloriann Loan, Hershal Coria 01/25/16 [place], MD 01/27/16 1530

## 2016-01-25 NOTE — Discharge Instructions (Signed)
Your symptoms were likely related to a sudden rise in your blood pressure.  Your labs, EKG, and chest xray are normal today.  Follow up with your primary care physician and pick up the samples of your blood pressure medicine.  Return to the ED for chest pain, shortness of breath, visual changes, severe headache, or any new or concerning symptoms.

## 2016-01-25 NOTE — ED Notes (Signed)
Bed: [id number] Expected date:  Expected time:  Means of arrival:  Comments: EMS- 40yo F, dizziness/n/v

## 2018-06-19 IMAGING — CR DG CHEST 2V
2 series · 2 of 2 positions shown · non-contrast
Comparison: 05/14/2015

CLINICAL DATA: Dizziness, vomiting, short of breath today.

EXAM:
CHEST  2 VIEW

[w chest pa]
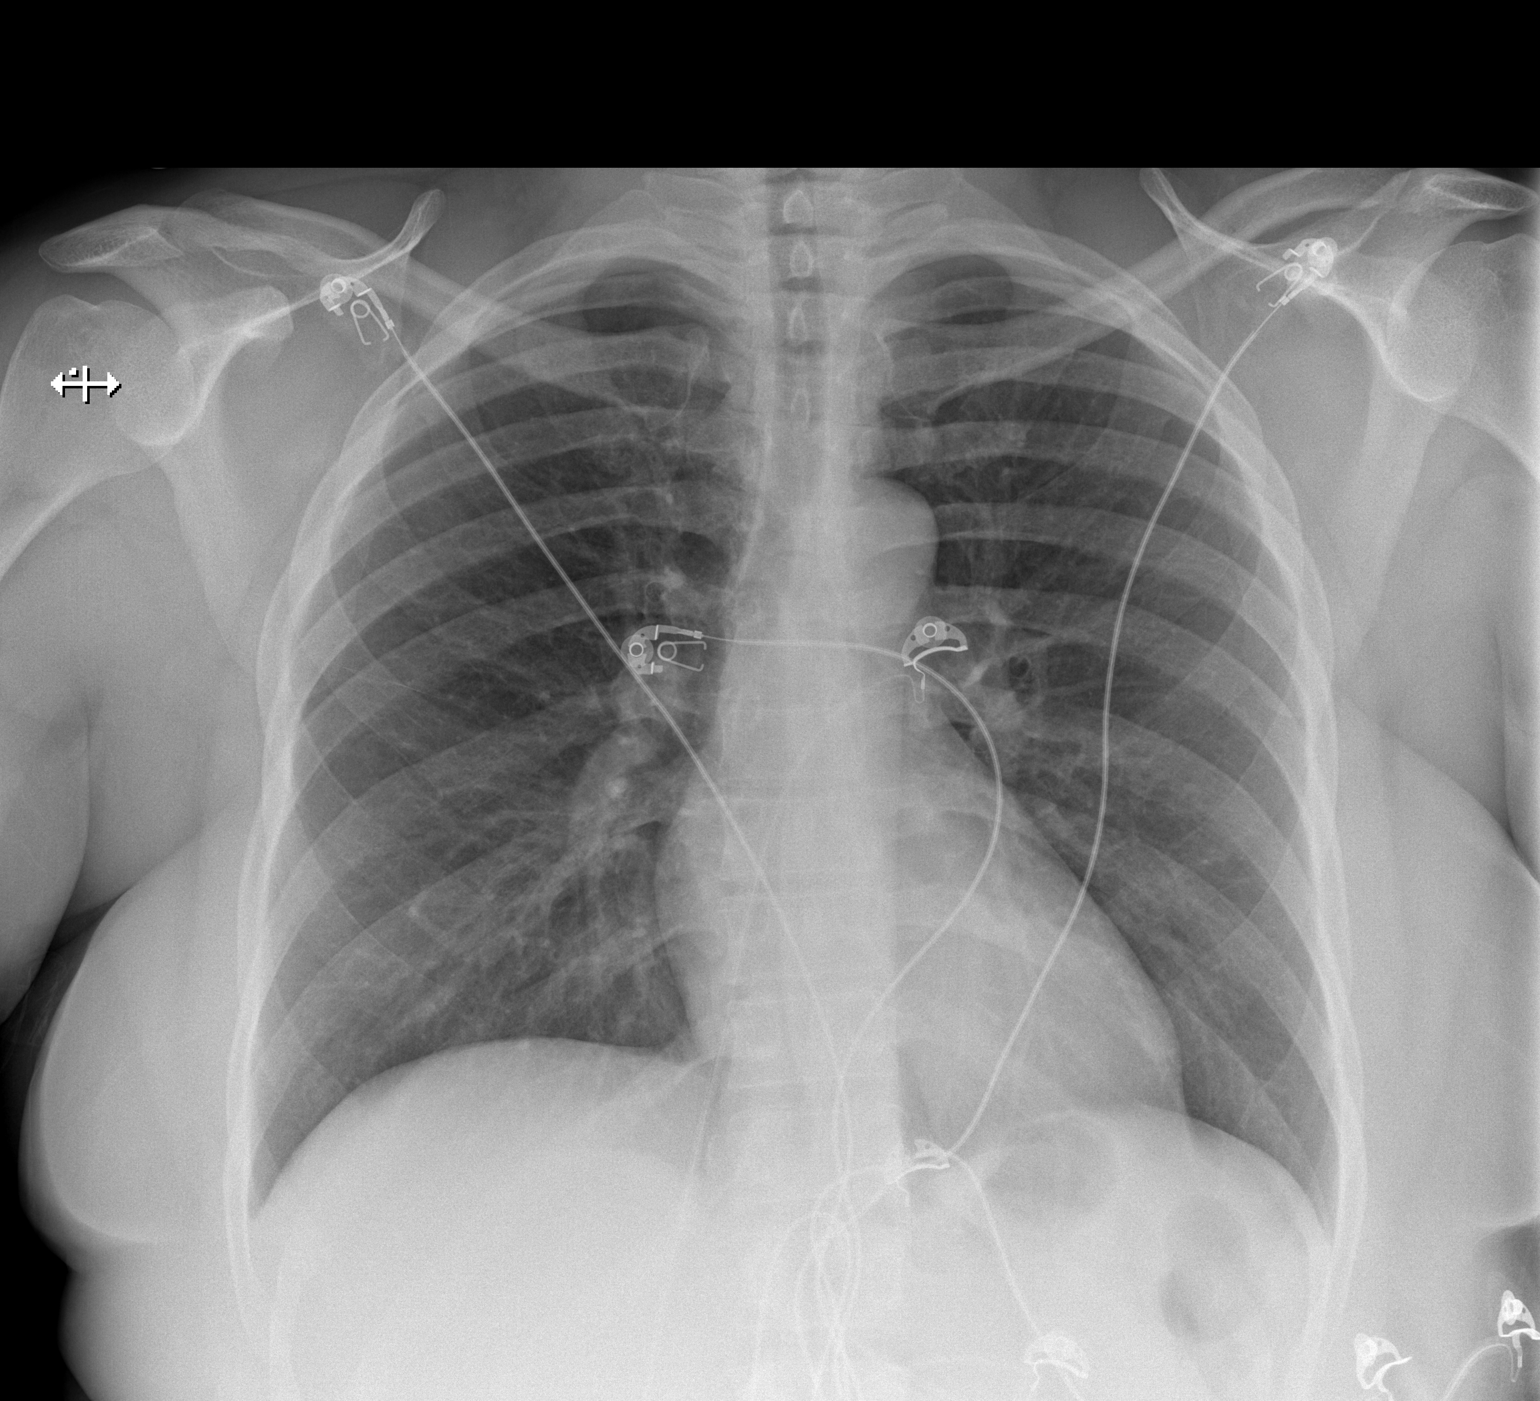

[w chest lat]
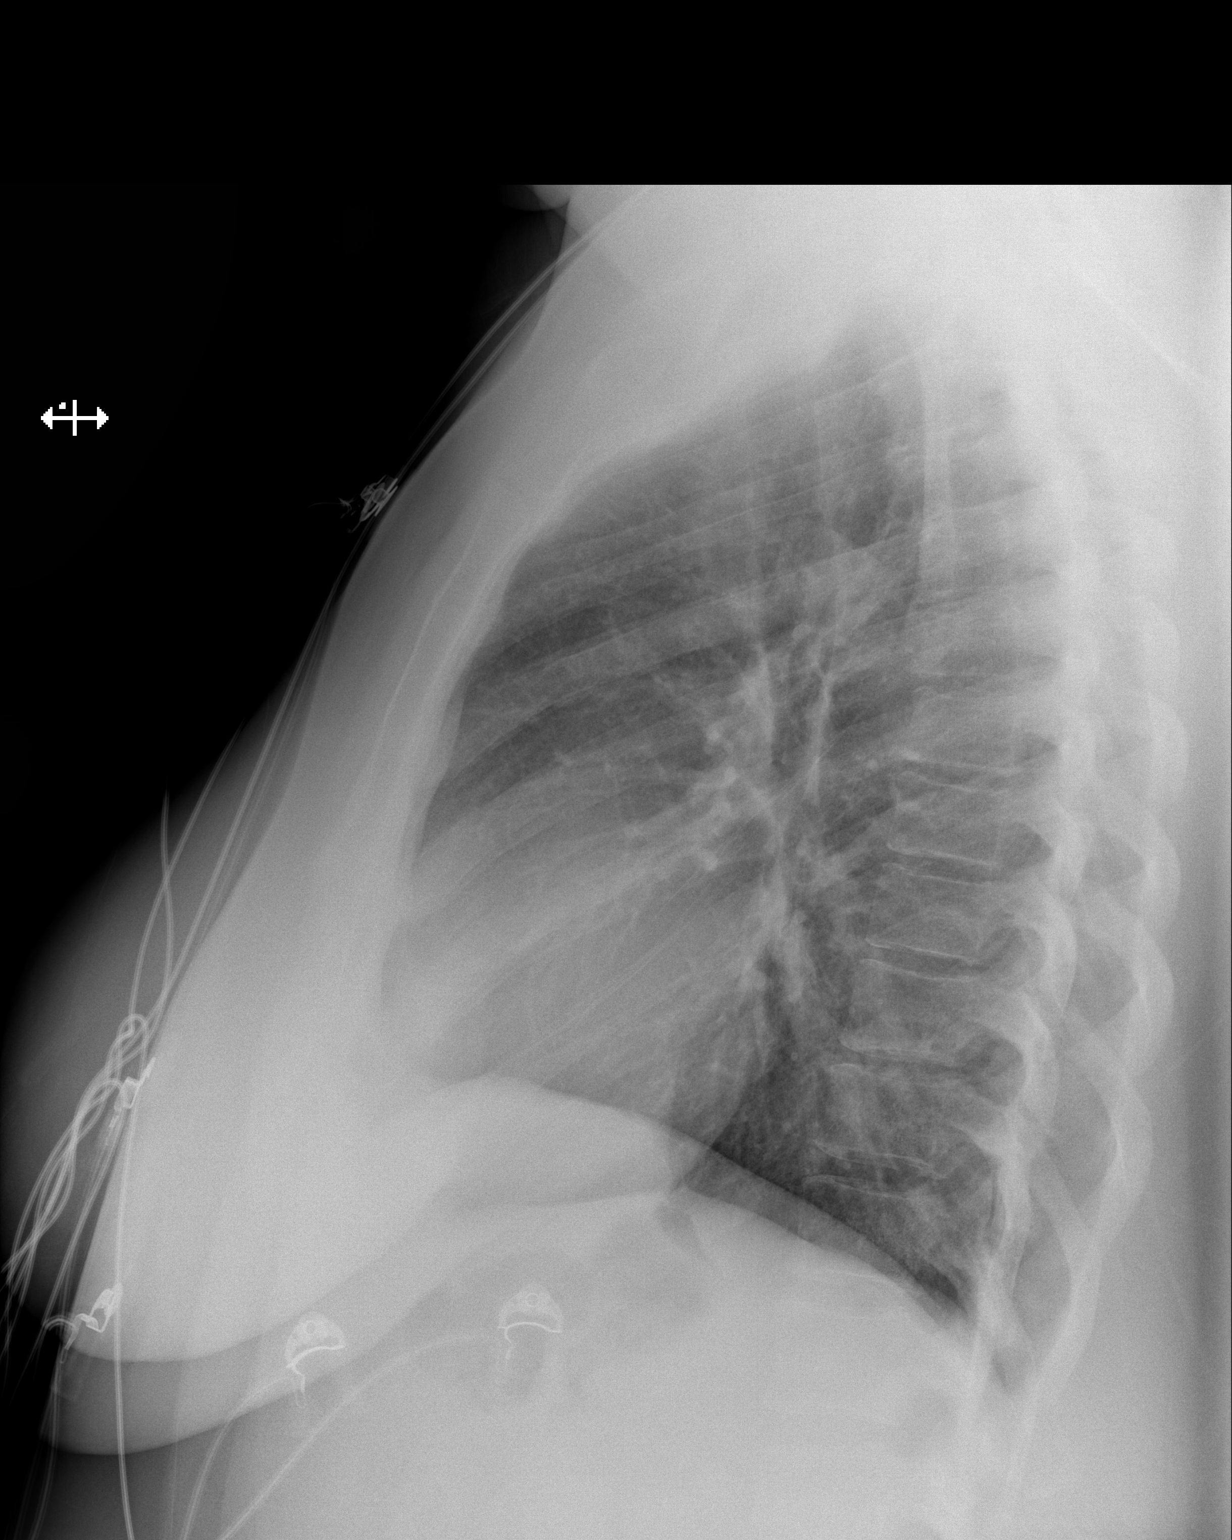

[2 of 2 positions shown; findings below may reference images not displayed]

FINDINGS: Normal heart size. Lungs clear. No pneumothorax. No pleural
effusion.
IMPRESSION: No active cardiopulmonary disease.

## 2019-12-19 ENCOUNTER — Other Ambulatory Visit: Payer: Self-pay | Admitting: Family Medicine

## 2019-12-19 DIAGNOSIS — Z1231 Encounter for screening mammogram for malignant neoplasm of breast: Secondary | ICD-10-CM

## 2019-12-23 ENCOUNTER — Ambulatory Visit: Payer: BLUE CROSS/BLUE SHIELD

## 2020-01-16 ENCOUNTER — Ambulatory Visit: Payer: BLUE CROSS/BLUE SHIELD

## 2021-01-05 ENCOUNTER — Other Ambulatory Visit (HOSPITAL_COMMUNITY)
Admission: RE | Admit: 2021-01-05 | Discharge: 2021-01-05 | Disposition: A | Discharge status: Home / Self Care | Payer: No Typology Code available for payment source | Source: Ambulatory Visit | Attending: Family Medicine | Admitting: Family Medicine

## 2021-01-05 ENCOUNTER — Other Ambulatory Visit: Payer: Self-pay | Admitting: Family Medicine

## 2021-01-05 DIAGNOSIS — Z01411 Encounter for gynecological examination (general) (routine) with abnormal findings: Secondary | ICD-10-CM | POA: Insufficient documentation

## 2021-01-06 ENCOUNTER — Other Ambulatory Visit: Payer: Self-pay | Admitting: Family Medicine

## 2021-01-06 DIAGNOSIS — Z1231 Encounter for screening mammogram for malignant neoplasm of breast: Secondary | ICD-10-CM

## 2021-01-08 LAB — CYTOLOGY - PAP
Comment: NEGATIVE
Diagnosis: NEGATIVE
High risk HPV: NEGATIVE

## 2021-02-10 ENCOUNTER — Ambulatory Visit: Payer: BLUE CROSS/BLUE SHIELD

## 2021-02-12 ENCOUNTER — Other Ambulatory Visit: Payer: Self-pay | Admitting: Radiology

## 2022-03-24 ENCOUNTER — Encounter (HOSPITAL_BASED_OUTPATIENT_CLINIC_OR_DEPARTMENT_OTHER): Payer: Self-pay | Admitting: Obstetrics and Gynecology

## 2022-03-24 DIAGNOSIS — R7309 Other abnormal glucose: Secondary | ICD-10-CM | POA: Diagnosis not present

## 2022-03-24 DIAGNOSIS — N920 Excessive and frequent menstruation with regular cycle: Secondary | ICD-10-CM | POA: Diagnosis not present

## 2022-03-24 DIAGNOSIS — D259 Leiomyoma of uterus, unspecified: Secondary | ICD-10-CM | POA: Diagnosis not present

## 2022-03-24 DIAGNOSIS — N946 Dysmenorrhea, unspecified: Secondary | ICD-10-CM | POA: Diagnosis not present

## 2022-03-24 NOTE — Progress Notes (Signed)
Your procedure is scheduled on :  Tuesday 03-29-2022  Report to [facility] AM   Call this number if you have problems the morning of surgery  :[phone].   OUR ADDRESS IS [address].  WE ARE LOCATED IN [facility].  PLEASE BRING YOUR INSURANCE CARD AND PHOTO ID DAY OF SURGERY.  ONLY 2 PEOPLE ARE ALLOWED IN  WAITING  ROOM.                                      REMEMBER:  DO NOT EAT FOOD, CANDY GUM OR MINTS  AFTER MIDNIGHT THE NIGHT BEFORE YOUR SURGERY . YOU MAY HAVE CLEAR LIQUIDS FROM MIDNIGHT THE NIGHT BEFORE YOUR SURGERY UNTIL  _7:15 AM. NO CLEAR LIQUIDS AFTER   7:15 AM_ DAY OF SURGERY.  YOU MAY  BRUSH YOUR TEETH MORNING OF SURGERY AND RINSE YOUR MOUTH OUT, NO CHEWING GUM CANDY OR MINTS.     CLEAR LIQUID DIET   Liquids Allowed                                                                     Foods Excluded  Coffee and tea, regular and decaf                                       no solid/ soft foods MAY NOT any type cream or milk products only sweeten                              Water                                                                                              Sports drinks like Gatorade _____________________________________________________________________     TAKE ONLY THESE MEDICATIONS MORNING OF SURGERY:  Escitalopram (Lexapro), Rosuvastatin (Crestor)    UP TO 4 VISITORS  MAY VISIT IN THE EXTENDED RECOVERY ROOM UNTIL 800 PM ONLY.  ONE  VISITOR AGE 3 AND OVER MAY SPEND THE NIGHT AND MUST BE IN EXTENDED RECOVERY ROOM NO LATER THAN 800 PM . YOUR DISCHARGE TIME AFTER YOU SPEND THE NIGHT IS 900 AM THE MORNING AFTER YOUR SURGERY.  YOU MAY PACK A SMALL OVERNIGHT BAG WITH TOILETRIES FOR YOUR OVERNIGHT STAY IF YOU WISH.  YOUR PRESCRIPTION MEDICATIONS WILL BE PROVIDED DURING [facility].                                      DO NOT WEAR JEWERLY, MAKE UP.  DO NOT WEAR LOTIONS, POWDERS, PERFUMES  OR NAIL POLISH ON YOUR FINGERNAILS. TOENAIL POLISH IS OK TO WEAR. DO NOT SHAVE FOR 48 HOURS PRIOR TO DAY OF SURGERY. CONTACTS, GLASSES, OR DENTURES MAY NOT BE WORN TO SURGERY.  REMEMBER: NO SMOKING, DRUGS OR ALCOHOL FOR 24 HOURS BEFORE YOUR SURGERY.                                    [facility] IS NOT RESPONSIBLE  FOR ANY BELONGINGS.                                                                    Marland Kitchen           [facility] - Preparing for Surgery Before surgery, you can play an important role.  Because skin is not sterile, your skin needs to be as free of germs as possible.  You can reduce the number of germs on your skin by washing with CHG (chlorahexidine gluconate) soap before surgery.  CHG is an antiseptic cleaner which kills germs and bonds with the skin to continue killing germs even after washing. Please DO NOT use if you have an allergy to CHG or antibacterial soaps.  If your skin becomes reddened/irritated stop using the CHG and inform your nurse when you arrive at Short Stay. Do not shave (including legs and underarms) for at least 48 hours prior to the first CHG shower.  You may shave your face/neck. Please follow these instructions carefully:  1.  Shower with CHG Soap the night before surgery and the  morning of Surgery.  2.  If you choose to wash your hair, wash your hair first as usual with your  normal  shampoo.  3.  After you shampoo, rinse your hair and body thoroughly to remove the  shampoo.                                        4.  Use CHG as you would any other liquid soap.  You can apply chg directly  to the skin and wash , chg soap provided, night before and morning of your surgery.  5.  Apply the CHG Soap to your body ONLY FROM THE NECK DOWN.   Do not use on face/ open                           Wound or open sores. Avoid contact with eyes, ears mouth and genitals (private parts).                       Wash face,  Genitals (private parts) with your normal soap.              6.  Wash thoroughly, paying special attention to the area where your surgery  will be performed.  7.  Thoroughly rinse your body with warm water from the neck down.  8.  DO NOT shower/wash with your normal soap after using and rinsing off  the CHG Soap.  9.  Pat yourself dry with a clean towel.            10.  Wear clean pajamas.            11.  Place clean sheets on your bed the night of your first shower and do not  sleep with pets. Day of Surgery : Do not apply any lotions/deodorants the morning of surgery.  Please wear clean clothes to the hospital/surgery center.  IF YOU HAVE ANY SKIN IRRITATION OR PROBLEMS WITH THE SURGICAL SOAP, PLEASE GET A BAR OF GOLD DIAL SOAP AND SHOWER THE NIGHT BEFORE YOUR SURGERY AND THE MORNING OF YOUR SURGERY. PLEASE LET THE NURSE KNOW MORNING OF YOUR SURGERY IF YOU HAD ANY PROBLEMS WITH THE SURGICAL SOAP.   ________________________________________________________________________                                                        QUESTIONS [facility] PRE OP NURSE PHONE [phone]  or [facility] pre-op nurse [phone]

## 2022-03-24 NOTE — Progress Notes (Signed)
Spoke w/ via phone for pre-op interview--- pt Lab needs dos----  urine preg             Lab results------ pt has lab appt 03-25-2022 @ 0830, CBC/ BMP/ T&S/ EKG COVID test -----patient states asymptomatic no test needed Arrive at -------  0815 on 03-29-2022 NPO after MN NO Solid Food.  Clear liquids from MN until--- 0715 Med rec completed Medications to take morning of surgery ----- lexapro, crestor Diabetic medication ----- pt will not do mounjaro Sunday prior to surgery.  Pt stated was given instructions from office not to do it Sunday prior.  Pt stated last dose 03-20-2022 Patient instructed no nail polish to be worn day of surgery Patient instructed to bring photo id and insurance card day of surgery Patient aware to have Driver (ride ) / caregiver    for 24 hours after surgery -- husband, Cindy Curry Patient Special Instructions ----- pt will have pick up bag with printed instructions and hibiclens at lab appt  Pre-Op special Istructions -----  pre-op orders pending.  Case just added on today. Patient verbalized understanding of instructions that were given at this phone interview. Patient denies shortness of breath, chest pain, fever, cough at this phone interview.

## 2022-03-25 ENCOUNTER — Encounter (HOSPITAL_COMMUNITY)
Admission: RE | Admit: 2022-03-25 | Discharge: 2022-03-25 | Disposition: A | Discharge status: Home / Self Care | Payer: No Typology Code available for payment source | Source: Ambulatory Visit | Attending: Family Medicine | Admitting: Family Medicine

## 2022-03-25 DIAGNOSIS — Z01818 Encounter for other preprocedural examination: Secondary | ICD-10-CM

## 2022-03-25 LAB — CBC
HCT: 35.8 % — ABNORMAL LOW (ref 36.0–46.0)
Hemoglobin: 12.2 g/dL (ref 12.0–15.0)
MCH: 31.1 pg (ref 26.0–34.0)
MCHC: 34.1 g/dL (ref 30.0–36.0)
MCV: 91.3 fL (ref 80.0–100.0)
Platelets: 305 10*3/uL (ref 150–400)
RBC: 3.92 MIL/uL (ref 3.87–5.11)
RDW: 13.6 % (ref 11.5–15.5)
WBC: 5.4 10*3/uL (ref 4.0–10.5)
nRBC: 0 % (ref 0.0–0.2)

## 2022-03-25 LAB — BASIC METABOLIC PANEL
Anion gap: 8 (ref 5–15)
BUN: 11 mg/dL (ref 6–20)
CO2: 26 mmol/L (ref 22–32)
Calcium: 8.9 mg/dL (ref 8.9–10.3)
Chloride: 104 mmol/L (ref 98–111)
Creatinine, Ser: 0.89 mg/dL (ref 0.44–1.00)
GFR, Estimated: >60 mL/min (ref >60)
Glucose, Bld: 103 mg/dL — ABNORMAL HIGH (ref 70–99)
Potassium: 3.4 mmol/L — ABNORMAL LOW (ref 3.5–5.1)
Sodium: 138 mmol/L (ref 135–145)

## 2022-03-25 LAB — TYPE AND SCREEN

## 2022-03-28 ENCOUNTER — Other Ambulatory Visit: Payer: Self-pay | Admitting: Obstetrics and Gynecology

## 2022-03-28 DIAGNOSIS — D219 Benign neoplasm of connective and other soft tissue, unspecified: Secondary | ICD-10-CM

## 2022-03-28 NOTE — H&P (Signed)
Subjective:    Chief Complaint(s):    Surgical Consult / Fibroids/ Abnormal Uterine Bleeding/ Pelvic pain    HPI:      General          47 y/o presents for surgucal consultation. reports a regular cycle in August and within a week she had another cycle. she reports bleeding 25 out of 30 days each month every month since August. she changes an overnight pad every 2-3 hours. she was using ultra tampon every 30 minutes on her heaviest day. she is not currently bleeding. she was placed on Lysteda by NP trimble however that was not working. she reports pelvic pain associated with the bleeding. The pain is worse the first 2 days that the cycle starts.             GYN ULTRASOUND FINDINGS ON 02/16/2022:             Anteverted uterus inhomogeneous in appearance. 10 cm, x 5 cm x 5 cm             ? Small uterine fibroids             1) Anterior 1.5x1.0cm             2) posterior 1.3x1.3cm             Endometrium difficult to clearly visualize and evaluate/ ? slightly thickened and irregular. Endo 0.59cm             Rt OV complex cyst 1.8x1.7x1.1cm, avascular             Lt OV wnl             no adnexal masses seen             Small amt of FF seen in adnexa.            EMB on 02/16/2022 was negative.            Pt reports taking Rx lysteda to manage heavy menses and flow improved initially however the lysteda is no longer working to control menses.     Current Medication:     Taking    Ibuprofen 800 MG Tablet 1 tablet with food or milk as needed Orally every 8 hrs.    Meclizine HCl 25 MG Tablet 1 tablet as needed Orally every 12 hrs.    Norethindrone 0.35 MG Tablet 1 tablet Orally Once a day.    Potassium Chloride ER 20 MEQ Tablet Extended Release TAKE 1 TABLET BY MOUTH EVERY DAY WITH FOOD FOR 30 DAYS.    Accu-Chek Guide w/Device Kit use to check your blood sugar finger stick once every other day.    Accu-Chek Guide(Glucose Blood) - Strip use to check your blood sugar finger stick once every other  day.    Accu-Chek Softclix Lancets(Lancets) - Miscellaneous use to check your blood sugar finger stick once every other day.    Mounjaro(Tirzepatide) 5 MG/0.5ML Solution Pen-injector AS DIRECTED SUBCUTANEOUS ONCE A WEEK.    Rosuvastatin Calcium 5 MG Tablet 1 tablet Orally Once a day.     Discontinued    Escitalopram Oxalate 10 MG Tablet 1 tablet Orally Once a day.    amLODIPine Besylate 2.5 MG Tablet 1 tablet Orally Once a day.    Lysteda 650 MG Tablet 2 tablets Orally Three times daily on days 1- 5 of menses.    Tranexamic Acid 650 MG Tablet take 2 tabs three times a day on days  1-5 of menses Orally as directed , Notes to Pharmacist: prn.   Medication List reviewed and reconciled with the patient.    Medical History:    Mixed hyperlipidemia    Diabetes mellitus without mention of complication, type 2 or unspecified type, not stated as uncontrolled    Essential hypertension, benign    vertigo    Allergies/Intolerance:      N.K.D.A.    Gyn History:    Sexual activity currently sexually active-rare.  Periods : every month, heavy flow.  LMP 03/09/2022- 03/19/2022.  Birth control BTL.  Last pap smear date 12/2020 normal/HPV -.  Last mammogram date 01/2021 - negative.  Denies Abnormal pap smear.  Denies STD.     OB History:    Number of pregnancies  3.  Pregnancy # 1  1999, girl, vaginal delivery.  Pregnancy # 2  2007, vaginal delivery, boy.  Pregnancy # 3  2010, vaginal delivery, boy.     Surgical History:    BTL 2010    Hospitalization:    Childbirth x 3 1999, 2007, 2010    Family History:   Father: alive 26 yrs, GERD, diagnosed with Hypertension, Atrial fibrillation   Mother: alive 6 yrs, diagnosed with Hypertension   Brother 1: alive 6 yrs, 13 sibling, diagnosed with Hypertension   1 brother(s) . 2 son(s) , 1 daughter(s) .    Social History:      General         Tobacco use cigarettes:  Former smoker, Quit in year  2022, Tobacco history last updated   03/24/2022, Vaping  No.           Alcohol: yes, wine, socially.          Caffeine: yes, occasional , tea.          Recreational drug use: no.          DIET: Less Pork and Fried foods.          Exercise: Walking some.          Marital Status: Single, new partner in past 6 months.          Children: 2, son (s), 1, daughter (s).          OCCUPATION: Medical Billing/Coder.    ROS:      CONSTITUTIONAL         Chills  No.  Fatigue  No.  Fever  No.  Night sweats  No.  Recent travel outside [place]  No.  Sweats  No.  Weight change  No.       OPHTHALMOLOGY         Blurring of vision  no.  Change in vision  no.  Double vision  no.       ENT         Dizziness  no.  Nose bleeds  no.  Sore throat  no.  Teeth pain  no.       ALLERGY         Hives  no.       CARDIOLOGY         Chest pain  no.  High blood pressure  no.  Irregular heart beat  no.  Leg edema  no.  Palpitations  no.       RESPIRATORY         Shortness of breath  no.  Cough  no.  Wheezing  no.       UROLOGY  Pain with urination  no.  Urinary urgency  no.  Urinary frequency  no.  Urinary incontinence  no.  Difficulty urinating  No.  Blood in urine  No.       GASTROENTEROLOGY         Abdominal pain  no.  Appetite change  no.  Bloating/belching  no.  Blood in stool or on toilet paper  no.  Change in bowel movements  no.  Constipation  no.  Diarrhea  no.  Difficulty swallowing  no.  Nausea  no.       FEMALE REPRODUCTIVE         Vulvar pain  no.  Vulvar rash  no.  Abnormal vaginal bleeding  no.  Breast pain  no.  Nipple discharge  no.  Pain with intercourse  no.  Pelvic pain  no.  Unusual vaginal discharge  no.  Vaginal itching  no.       MUSCULOSKELETAL         Muscle aches  no.       NEUROLOGY         Headache  no.  Tingling/numbness  no.  Weakness  no.       PSYCHOLOGY         Depression  no.  Anxiety  no.  Nervousness  no.  Sleep disturbances  no.  Suicidal ideation  no .       ENDOCRINOLOGY         Excessive thirst  no.  Excessive  urination  no.  Hair loss  no.  Heat or cold intolerance  no.       HEMATOLOGY/LYMPH         Abnormal bleeding  no.  Easy bruising  no.  Swollen glands  no.       DERMATOLOGY         New/changing skin lesion  no.  Rash  no.  Sores  no.         Negative except as stated in HPI.  Objective:    Vitals:        Wt: 187.4, Wt change: -4 lbs, Ht: 67.25, BMI: 29.13, Pulse sitting: 78, BP sitting: 130/84.    Past Results:    Examination:      General Examination         CONSTITUTIONAL: alert, oriented, NAD.          SKIN:  moist, warm.          EYES:  Conjunctiva clear.          LUNGS: good I:E efffort noted, clear to auscultation bilaterally.          HEART: regular rate and rhythm.          ABDOMEN: soft, non-tender/non-distended, bowel sounds present.          FEMALE GENITOURINARY: normal external genitalia, labia - unremarkable, vagina - pink moist mucosa, no lesions or abnormal discharge, cervix - no discharge or lesions or CMT, adnexa - no masses or tenderness, uterus - nontender and normal size on palpation.          EXTREMITIES: no edema present.          PSYCH:  affect normal, good eye contact.     Physical Examination:      Chaperone present          Chaperone present Beather Arbour 03/24/2022 10:36:49 AM >, for pelvic exam.    Assessment:    Assessment:    Fibroids -  D25.9 (Primary)    Menorrhagia - N92.0    Dysmenorrhea - N94.6   Plan:    Treatment:     Fibroids          Notes: I discussed with Ms.Yzaguirre options of conservative management including repeat ultrasound in 6 months and aygestin 5 mg or myfembree daily to attempt to control bleeding. I discussed hysteroscopy D&C with HTA/ and hysterectomy ( as definitive managment). R/B/A of robotic hysterectomy were discussed including but not limited to infection / bleeding/ damage to bowel, bladder , ureters and surrounding organs with the need for further surgery. r/o conversion to larger incision discusssed.  Pt desires  definitive therapy with hysterectomy. she would like this to be performed as soon as possible due to pain and the impact on quality of life. Planning robotic-assisted laparoscopic hysterectomy with bilateral salpingectomy. Pt advised she may stay overnight, or 2 days if conversion to larger incision. Discussed risks of hysterectomy including but not limited to infection, bleeding, conversion to larger incision, damage to her bowel, bladder, or ureters, with the need for further surgery. Discussed risk of blood transfusion and risk of HIV or hep B&C with blood transfusion. Pt is aware of risks and desires blood transfusion if needed. Pt advised to avoid NSAIDs (Aspirin, Aleve, Advil, Ibuprofen, Motrin) from now until surgery given risk of bleeding during surgery. She may take Tylenol for pain management. She is advised to avoid eating or drinking starting midnight prior to surgery. Discussed post-surgery avoidance of driving for 1 week and avoidance of lifting weight greater than 10 lbs or intercourse for 6-8 weeks after procedure.          Referral To:               Reason:please check insurance coverage of robotic assisted laparoscpic hysterectomy with bilateral salpingectomy               Status:Open     Menorrhagia          Notes: I discussed with Ms.Isaza options of conservative management including repeat ultrasound in 6 months and aygestin 5 mg or myfembree daily to attempt to control bleeding. I discussed hysteroscopy D&C with HTA/ and hysterectomy ( as definitive managment). R/B/A of robotic hysterectomy were discussed including but not limited to infection / bleeding/ damage to bowel, bladder , ureters and surrounding organs with the need for further surgery. r/o conversion to larger incision discusssed.  Pt desires definitive therapy with hysterectomy. she would like this to be performed as soon as possible due to pain and the impact on quality of life. Planning robotic-assisted laparoscopic  hysterectomy with bilateral salpingectomy. Pt advised she may stay overnight, or 2 days if conversion to larger incision. Discussed risks of hysterectomy including but not limited to infection, bleeding, conversion to larger incision, damage to her bowel, bladder, or ureters, with the need for further surgery. Discussed risk of blood transfusion and risk of HIV or hep B&C with blood transfusion. Pt is aware of risks and desires blood transfusion if needed. Pt advised to avoid NSAIDs (Aspirin, Aleve, Advil, Ibuprofen, Motrin) from now until surgery given risk of bleeding during surgery. She may take Tylenol for pain management. She is advised to avoid eating or drinking starting midnight prior to surgery. Discussed post-surgery avoidance of driving for 1 week and avoidance of lifting weight greater than 10 lbs or intercourse for 6-8 weeks after procedure.          Referral To:  Reason:please check insurance coverage of robotic assisted laparoscpic hysterectomy with bilateral salpingectomy               Status:Open     Dysmenorrhea          Notes: I discussed with Ms.Hallinan options of conservative management including repeat ultrasound in 6 months and aygestin 5 mg or myfembree daily to attempt to control bleeding. I discussed hysteroscopy D&C with HTA/ and hysterectomy ( as definitive managment). R/B/A of robotic hysterectomy were discussed including but not limited to infection / bleeding/ damage to bowel, bladder , ureters and surrounding organs with the need for further surgery. r/o conversion to larger incision discusssed.  Pt desires definitive therapy with hysterectomy. she would like this to be performed as soon as possible due to pain and the impact on quality of life. Planning robotic-assisted laparoscopic hysterectomy with bilateral salpingectomy. Pt advised she may stay overnight, or 2 days if conversion to larger incision. Discussed risks of hysterectomy including but not limited to  infection, bleeding, conversion to larger incision, damage to her bowel, bladder, or ureters, with the need for further surgery. Discussed risk of blood transfusion and risk of HIV or hep B&C with blood transfusion. Pt is aware of risks and desires blood transfusion if needed. Pt advised to avoid NSAIDs (Aspirin, Aleve, Advil, Ibuprofen, Motrin) from now until surgery given risk of bleeding during surgery. She may take Tylenol for pain management. She is advised to avoid eating or drinking starting midnight prior to surgery. Discussed post-surgery avoidance of driving for 1 week and avoidance of lifting weight greater than 10 lbs or intercourse for 6-8 weeks after procedure.

## 2022-03-28 NOTE — H&P (Deleted)
  The note originally documented on this encounter has been moved the the encounter in which it belongs.

## 2022-03-29 ENCOUNTER — Observation Stay (HOSPITAL_BASED_OUTPATIENT_CLINIC_OR_DEPARTMENT_OTHER): Payer: No Typology Code available for payment source | Admitting: Anesthesiology

## 2022-03-29 ENCOUNTER — Observation Stay (HOSPITAL_BASED_OUTPATIENT_CLINIC_OR_DEPARTMENT_OTHER)
Admission: RE | Admit: 2022-03-29 | Discharge: 2022-03-30 | Disposition: A | Discharge status: Home / Self Care | Payer: No Typology Code available for payment source | Attending: Obstetrics and Gynecology | Admitting: Obstetrics and Gynecology

## 2022-03-29 ENCOUNTER — Other Ambulatory Visit: Payer: Self-pay

## 2022-03-29 ENCOUNTER — Encounter (HOSPITAL_BASED_OUTPATIENT_CLINIC_OR_DEPARTMENT_OTHER)
Admission: RE | Disposition: A | Discharge status: Home / Self Care | Payer: Self-pay | Source: Home / Self Care | Attending: Obstetrics and Gynecology

## 2022-03-29 ENCOUNTER — Encounter (HOSPITAL_BASED_OUTPATIENT_CLINIC_OR_DEPARTMENT_OTHER): Payer: Self-pay | Admitting: Obstetrics and Gynecology

## 2022-03-29 DIAGNOSIS — D219 Benign neoplasm of connective and other soft tissue, unspecified: Secondary | ICD-10-CM | POA: Diagnosis present

## 2022-03-29 DIAGNOSIS — N920 Excessive and frequent menstruation with regular cycle: Secondary | ICD-10-CM | POA: Insufficient documentation

## 2022-03-29 DIAGNOSIS — N946 Dysmenorrhea, unspecified: Secondary | ICD-10-CM | POA: Insufficient documentation

## 2022-03-29 DIAGNOSIS — Z01818 Encounter for other preprocedural examination: Secondary | ICD-10-CM

## 2022-03-29 DIAGNOSIS — D259 Leiomyoma of uterus, unspecified: Secondary | ICD-10-CM

## 2022-03-29 DIAGNOSIS — Z90710 Acquired absence of both cervix and uterus: Secondary | ICD-10-CM

## 2022-03-29 DIAGNOSIS — R7309 Other abnormal glucose: Secondary | ICD-10-CM | POA: Insufficient documentation

## 2022-03-29 HISTORY — DX: Palpitations: R00.2

## 2022-03-29 HISTORY — DX: Generalized anxiety disorder: F41.1

## 2022-03-29 HISTORY — PX: ROBOTIC ASSISTED LAPAROSCOPIC HYSTERECTOMY AND SALPINGECTOMY: SHX6379

## 2022-03-29 HISTORY — DX: Type 2 diabetes mellitus without complications: E11.9

## 2022-03-29 HISTORY — DX: Anemia, unspecified: D64.9

## 2022-03-29 HISTORY — DX: Dysmenorrhea, unspecified: N94.6

## 2022-03-29 HISTORY — DX: Personal history of cervical dysplasia: Z87.410

## 2022-03-29 HISTORY — DX: Presence of spectacles and contact lenses: Z97.3

## 2022-03-29 HISTORY — DX: Excessive and frequent menstruation with regular cycle: N92.0

## 2022-03-29 HISTORY — DX: Leiomyoma of uterus, unspecified: D25.9

## 2022-03-29 LAB — GLUCOSE, CAPILLARY
Glucose-Capillary: 90 mg/dL (ref 70–99)
Glucose-Capillary: 149 mg/dL — ABNORMAL HIGH (ref 70–99)
Glucose-Capillary: 153 mg/dL — ABNORMAL HIGH (ref 70–99)

## 2022-03-29 LAB — ABO/RH: ABO/RH(D): A POS

## 2022-03-29 LAB — TYPE AND SCREEN
ABO/RH(D): A POS
Antibody Screen: NEGATIVE

## 2022-03-29 LAB — POCT PREGNANCY, URINE: Preg Test, Ur: NEGATIVE

## 2022-03-29 SURGERY — XI ROBOTIC ASSISTED LAPAROSCOPIC HYSTERECTOMY AND SALPINGECTOMY
Anesthesia: General | Site: Abdomen | Laterality: Bilateral

## 2022-03-29 MED ORDER — LACTATED RINGERS IV SOLN: INTRAVENOUS | Status: DC

## 2022-03-29 MED ORDER — PROPOFOL 10 MG/ML IV BOLUS
INTRAVENOUS | Status: AC
  Filled 2022-03-29: qty 20

## 2022-03-29 MED ORDER — DROPERIDOL 2.5 MG/ML IJ SOLN
INTRAMUSCULAR | Status: DC | PRN
  Administered 2022-03-29: .625 mg via INTRAVENOUS

## 2022-03-29 MED ORDER — HYDROMORPHONE HCL 1 MG/ML IJ SOLN: 0.2000 mg | INTRAMUSCULAR | Status: DC | PRN

## 2022-03-29 MED ORDER — HYDROMORPHONE HCL 1 MG/ML IJ SOLN
0.2500 mg | INTRAMUSCULAR | Status: DC | PRN
  Administered 2022-03-29: 0.25 mg via INTRAVENOUS

## 2022-03-29 MED ORDER — IBUPROFEN 200 MG PO TABS
600.0000 mg | ORAL_TABLET | Freq: Four times a day (QID) | ORAL | Status: DC
  Administered 2022-03-29 – 2022-03-30 (×4): 600 mg via ORAL

## 2022-03-29 MED ORDER — ONDANSETRON HCL 4 MG/2ML IJ SOLN
4.0000 mg | Freq: Four times a day (QID) | INTRAMUSCULAR | Status: DC | PRN
  Administered 2022-03-29: 4 mg via INTRAVENOUS

## 2022-03-29 MED ORDER — GLYCOPYRROLATE PF 0.2 MG/ML IJ SOSY
PREFILLED_SYRINGE | INTRAMUSCULAR | Status: DC | PRN
  Administered 2022-03-29: .2 mg via INTRAVENOUS

## 2022-03-29 MED ORDER — LIDOCAINE HCL (PF) 2 % IJ SOLN
INTRAMUSCULAR | Status: AC
  Filled 2022-03-29: qty 5

## 2022-03-29 MED ORDER — GABAPENTIN 300 MG PO CAPS
ORAL_CAPSULE | ORAL | Status: AC
  Filled 2022-03-29: qty 1

## 2022-03-29 MED ORDER — OXYCODONE HCL 5 MG PO TABS: 5.0000 mg | ORAL_TABLET | Freq: Four times a day (QID) | ORAL | 0 refills | Status: AC | PRN

## 2022-03-29 MED ORDER — ONDANSETRON HCL 4 MG PO TABS: 4.0000 mg | ORAL_TABLET | Freq: Four times a day (QID) | ORAL | Status: DC | PRN

## 2022-03-29 MED ORDER — HYDRALAZINE HCL 20 MG/ML IJ SOLN
INTRAMUSCULAR | Status: AC
  Filled 2022-03-29: qty 1

## 2022-03-29 MED ORDER — INSULIN ASPART 100 UNIT/ML IJ SOLN: 0.0000 [IU] | Freq: Three times a day (TID) | INTRAMUSCULAR | Status: DC

## 2022-03-29 MED ORDER — SENNA 8.6 MG PO TABS
ORAL_TABLET | ORAL | Status: AC
  Filled 2022-03-29: qty 1

## 2022-03-29 MED ORDER — LACTATED RINGERS IV SOLN
INTRAVENOUS | Status: DC
  Administered 2022-03-29: 1000 mL via INTRAVENOUS

## 2022-03-29 MED ORDER — HYDROMORPHONE HCL 1 MG/ML IJ SOLN
INTRAMUSCULAR | Status: AC
  Filled 2022-03-29: qty 1

## 2022-03-29 MED ORDER — ONDANSETRON HCL 4 MG/2ML IJ SOLN
INTRAMUSCULAR | Status: AC
  Filled 2022-03-29: qty 2

## 2022-03-29 MED ORDER — DEXMEDETOMIDINE HCL IN NACL 80 MCG/20ML IV SOLN
INTRAVENOUS | Status: DC | PRN
  Administered 2022-03-29: 12 ug via BUCCAL

## 2022-03-29 MED ORDER — CELECOXIB 200 MG PO CAPS
400.0000 mg | ORAL_CAPSULE | ORAL | Status: AC
  Administered 2022-03-29: 400 mg via ORAL

## 2022-03-29 MED ORDER — MIDAZOLAM HCL 5 MG/5ML IJ SOLN
INTRAMUSCULAR | Status: DC | PRN
  Administered 2022-03-29: 2 mg via INTRAVENOUS

## 2022-03-29 MED ORDER — ROCURONIUM BROMIDE 10 MG/ML (PF) SYRINGE
PREFILLED_SYRINGE | INTRAVENOUS | Status: AC
  Filled 2022-03-29: qty 10

## 2022-03-29 MED ORDER — ROCURONIUM BROMIDE 10 MG/ML (PF) SYRINGE
PREFILLED_SYRINGE | INTRAVENOUS | Status: DC | PRN
  Administered 2022-03-29: 70 mg via INTRAVENOUS

## 2022-03-29 MED ORDER — LIDOCAINE 2% (20 MG/ML) 5 ML SYRINGE
INTRAMUSCULAR | Status: DC | PRN
  Administered 2022-03-29: 100 mg via INTRAVENOUS

## 2022-03-29 MED ORDER — ZOLPIDEM TARTRATE 5 MG PO TABS: 5.0000 mg | ORAL_TABLET | Freq: Every evening | ORAL | Status: DC | PRN

## 2022-03-29 MED ORDER — GABAPENTIN 300 MG PO CAPS
300.0000 mg | ORAL_CAPSULE | ORAL | Status: AC
  Administered 2022-03-29: 300 mg via ORAL

## 2022-03-29 MED ORDER — MIDAZOLAM HCL 2 MG/2ML IJ SOLN
INTRAMUSCULAR | Status: AC
  Filled 2022-03-29: qty 2

## 2022-03-29 MED ORDER — ACETAMINOPHEN 500 MG PO TABS
ORAL_TABLET | ORAL | Status: AC
  Filled 2022-03-29: qty 2

## 2022-03-29 MED ORDER — ACETAMINOPHEN 500 MG PO TABS
1000.0000 mg | ORAL_TABLET | Freq: Four times a day (QID) | ORAL | Status: DC
  Administered 2022-03-29 – 2022-03-30 (×3): 1000 mg via ORAL

## 2022-03-29 MED ORDER — ONDANSETRON HCL 4 MG/2ML IJ SOLN
INTRAMUSCULAR | Status: DC | PRN
  Administered 2022-03-29: 4 mg via INTRAVENOUS

## 2022-03-29 MED ORDER — POVIDONE-IODINE 10 % EX SWAB
2.0000 | Freq: Once | CUTANEOUS | Status: AC
  Administered 2022-03-29: 2 via TOPICAL

## 2022-03-29 MED ORDER — IBUPROFEN 200 MG PO TABS
ORAL_TABLET | ORAL | Status: AC
  Filled 2022-03-29: qty 3

## 2022-03-29 MED ORDER — DEXAMETHASONE SODIUM PHOSPHATE 10 MG/ML IJ SOLN
INTRAMUSCULAR | Status: DC | PRN
  Administered 2022-03-29: 5 mg via INTRAVENOUS

## 2022-03-29 MED ORDER — SENNA 8.6 MG PO TABS
1.0000 | ORAL_TABLET | Freq: Two times a day (BID) | ORAL | Status: DC
  Administered 2022-03-29 (×2): 8.6 mg via ORAL

## 2022-03-29 MED ORDER — KETOROLAC TROMETHAMINE 30 MG/ML IJ SOLN: 30.0000 mg | Freq: Once | INTRAMUSCULAR | Status: DC | PRN

## 2022-03-29 MED ORDER — SODIUM CHLORIDE 0.9 % IV SOLN
INTRAVENOUS | Status: AC
  Filled 2022-03-29: qty 2

## 2022-03-29 MED ORDER — HYDROCHLOROTHIAZIDE 25 MG PO TABS
25.0000 mg | ORAL_TABLET | Freq: Every day | ORAL | Status: DC
  Administered 2022-03-29: 25 mg via ORAL
  Filled 2022-03-29: qty 1

## 2022-03-29 MED ORDER — CELECOXIB 200 MG PO CAPS
ORAL_CAPSULE | ORAL | Status: AC
  Filled 2022-03-29: qty 2

## 2022-03-29 MED ORDER — GABAPENTIN 100 MG PO CAPS
ORAL_CAPSULE | ORAL | Status: AC
  Filled 2022-03-29: qty 1

## 2022-03-29 MED ORDER — FENTANYL CITRATE (PF) 250 MCG/5ML IJ SOLN
INTRAMUSCULAR | Status: AC
  Filled 2022-03-29: qty 5

## 2022-03-29 MED ORDER — PANTOPRAZOLE SODIUM 40 MG PO TBEC
40.0000 mg | DELAYED_RELEASE_TABLET | Freq: Every day | ORAL | Status: DC
  Administered 2022-03-29: 40 mg via ORAL

## 2022-03-29 MED ORDER — STERILE WATER FOR IRRIGATION IR SOLN
Status: DC | PRN
  Administered 2022-03-29: 500 mL

## 2022-03-29 MED ORDER — ARTIFICIAL TEARS OPHTHALMIC OINT
TOPICAL_OINTMENT | OPHTHALMIC | Status: AC
  Filled 2022-03-29: qty 10.5

## 2022-03-29 MED ORDER — ACETAMINOPHEN 500 MG PO TABS
1000.0000 mg | ORAL_TABLET | ORAL | Status: AC
  Administered 2022-03-29: 1000 mg via ORAL

## 2022-03-29 MED ORDER — HEMOSTATIC AGENTS (NO CHARGE) OPTIME
TOPICAL | Status: DC | PRN
  Administered 2022-03-29: 1

## 2022-03-29 MED ORDER — SUGAMMADEX SODIUM 200 MG/2ML IV SOLN
INTRAVENOUS | Status: DC | PRN
  Administered 2022-03-29: 200 mg via INTRAVENOUS
  Administered 2022-03-29: 100 mg via INTRAVENOUS

## 2022-03-29 MED ORDER — OXYCODONE HCL 5 MG PO TABS: 5.0000 mg | ORAL_TABLET | Freq: Once | ORAL | Status: DC | PRN

## 2022-03-29 MED ORDER — ALUM & MAG HYDROXIDE-SIMETH 200-200-20 MG/5ML PO SUSP: 30.0000 mL | ORAL | Status: DC | PRN

## 2022-03-29 MED ORDER — PROPOFOL 10 MG/ML IV BOLUS
INTRAVENOUS | Status: DC | PRN
  Administered 2022-03-29: 200 mg via INTRAVENOUS

## 2022-03-29 MED ORDER — ACETAMINOPHEN 500 MG PO TABS: 1000.0000 mg | ORAL_TABLET | Freq: Three times a day (TID) | ORAL | 0 refills | Status: AC | PRN

## 2022-03-29 MED ORDER — FENTANYL CITRATE (PF) 100 MCG/2ML IJ SOLN
INTRAMUSCULAR | Status: DC | PRN
  Administered 2022-03-29: 50 ug via INTRAVENOUS
  Administered 2022-03-29: 100 ug via INTRAVENOUS
  Administered 2022-03-29: 50 ug via INTRAVENOUS

## 2022-03-29 MED ORDER — ONDANSETRON HCL 4 MG/2ML IJ SOLN: 4.0000 mg | Freq: Once | INTRAMUSCULAR | Status: DC | PRN

## 2022-03-29 MED ORDER — SIMETHICONE 80 MG PO CHEW: 80.0000 mg | CHEWABLE_TABLET | Freq: Four times a day (QID) | ORAL | Status: DC | PRN

## 2022-03-29 MED ORDER — OXYCODONE HCL 5 MG PO TABS
5.0000 mg | ORAL_TABLET | ORAL | Status: DC | PRN
  Administered 2022-03-30: 5 mg via ORAL

## 2022-03-29 MED ORDER — LOSARTAN POTASSIUM 50 MG PO TABS
100.0000 mg | ORAL_TABLET | Freq: Every day | ORAL | Status: DC
  Administered 2022-03-29: 100 mg via ORAL
  Filled 2022-03-29: qty 2

## 2022-03-29 MED ORDER — GLYCOPYRROLATE PF 0.2 MG/ML IJ SOSY
PREFILLED_SYRINGE | INTRAMUSCULAR | Status: AC
  Filled 2022-03-29: qty 1

## 2022-03-29 MED ORDER — MENTHOL 3 MG MT LOZG: 1.0000 | LOZENGE | OROMUCOSAL | Status: DC | PRN

## 2022-03-29 MED ORDER — SODIUM CHLORIDE 0.9 % IR SOLN
Status: DC | PRN
  Administered 2022-03-29: 350 mL

## 2022-03-29 MED ORDER — PANTOPRAZOLE SODIUM 40 MG PO TBEC
DELAYED_RELEASE_TABLET | ORAL | Status: AC
  Filled 2022-03-29: qty 1

## 2022-03-29 MED ORDER — DEXAMETHASONE SODIUM PHOSPHATE 10 MG/ML IJ SOLN
INTRAMUSCULAR | Status: AC
  Filled 2022-03-29: qty 1

## 2022-03-29 MED ORDER — GABAPENTIN 100 MG PO CAPS
100.0000 mg | ORAL_CAPSULE | Freq: Two times a day (BID) | ORAL | Status: DC
  Administered 2022-03-29: 100 mg via ORAL

## 2022-03-29 MED ORDER — OXYCODONE HCL 5 MG/5ML PO SOLN: 5.0000 mg | Freq: Once | ORAL | Status: DC | PRN

## 2022-03-29 MED ORDER — IBUPROFEN 600 MG PO TABS: 600.0000 mg | ORAL_TABLET | Freq: Four times a day (QID) | ORAL | 1 refills | Status: AC | PRN

## 2022-03-29 MED ORDER — SODIUM CHLORIDE 0.9 % IV SOLN
INTRAVENOUS | Status: DC | PRN
  Administered 2022-03-29: 110 mL

## 2022-03-29 MED ORDER — SODIUM CHLORIDE 0.9 % IV SOLN
2.0000 g | INTRAVENOUS | Status: AC
  Administered 2022-03-29: 2 g via INTRAVENOUS

## 2022-03-29 SURGICAL SUPPLY — 77 items
ADH SKN CLS APL DERMABOND .7 (GAUZE/BANDAGES/DRESSINGS) ×1
APL SRG 38 LTWT LNG FL B (MISCELLANEOUS) ×1
APPLICATOR ARISTA FLEXITIP XL (MISCELLANEOUS) IMPLANT
BARRIER ADHS 3X4 INTERCEED (GAUZE/BANDAGES/DRESSINGS) IMPLANT
BRR ADH 4X3 ABS CNTRL BYND (GAUZE/BANDAGES/DRESSINGS)
CANNULA CAP OBTURATR AIRSEAL 8 (CAP) IMPLANT
CATH FOLEY 3WAY  5CC 16FR (CATHETERS) ×1
CATH FOLEY 3WAY 5CC 16FR (CATHETERS) ×1 IMPLANT
CELLS DAT CNTRL 66122 CELL SVR (MISCELLANEOUS) IMPLANT
COVER BACK TABLE 60X90IN (DRAPES) ×1 IMPLANT
COVER TIP SHEARS 8 DVNC (MISCELLANEOUS) ×1 IMPLANT
COVER TIP SHEARS 8MM DA VINCI (MISCELLANEOUS) ×1
DEFOGGER SCOPE WARMER CLEARIFY (MISCELLANEOUS) ×1 IMPLANT
DERMABOND ADVANCED .7 DNX12 (GAUZE/BANDAGES/DRESSINGS) ×1 IMPLANT
DILATOR CANAL MILEX (MISCELLANEOUS) ×1 IMPLANT
DRAPE ARM DVNC X/XI (DISPOSABLE) ×4 IMPLANT
DRAPE COLUMN DVNC XI (DISPOSABLE) ×1 IMPLANT
DRAPE DA VINCI XI ARM (DISPOSABLE) ×4
DRAPE DA VINCI XI COLUMN (DISPOSABLE) ×1
DRAPE SURG IRRIG POUCH 19X23 (DRAPES) ×1 IMPLANT
DRAPE UTILITY 15X26 TOWEL STRL (DRAPES) ×1 IMPLANT
DURAPREP 26ML APPLICATOR (WOUND CARE) ×1 IMPLANT
ELECT REM PT RETURN 9FT ADLT (ELECTROSURGICAL) ×1
ELECTRODE REM PT RTRN 9FT ADLT (ELECTROSURGICAL) ×1 IMPLANT
GAUZE 4X4 16PLY ~~LOC~~+RFID DBL (SPONGE) ×2 IMPLANT
GLOVE BIOGEL M 6.5 STRL (GLOVE) ×3 IMPLANT
GLOVE BIOGEL M 7.0 STRL (GLOVE) ×3 IMPLANT
GLOVE BIOGEL PI IND STRL 6.5 (GLOVE) ×6 IMPLANT
GLOVE BIOGEL PI IND STRL 7.0 (GLOVE) ×6 IMPLANT
HEMOSTAT ARISTA ABSORB 3G PWDR (HEMOSTASIS) IMPLANT
HOLDER FOLEY CATH W/STRAP (MISCELLANEOUS) IMPLANT
IRRIG SUCT STRYKERFLOW 2 WTIP (MISCELLANEOUS) ×1
IRRIGATION SUCT STRKRFLW 2 WTP (MISCELLANEOUS) ×1 IMPLANT
IV NS 1000ML (IV SOLUTION) ×1
IV NS 1000ML BAXH (IV SOLUTION) IMPLANT
KIT PINK PAD W/HEAD ARE REST (MISCELLANEOUS) ×1
KIT PINK PAD W/HEAD ARM REST (MISCELLANEOUS) ×1 IMPLANT
KIT TURNOVER CYSTO (KITS) ×1 IMPLANT
LEGGING LITHOTOMY PAIR STRL (DRAPES) ×1 IMPLANT
MANIFOLD NEPTUNE II (INSTRUMENTS) ×1 IMPLANT
OBTURATOR OPTICAL STANDARD 8MM (TROCAR) ×1
OBTURATOR OPTICAL STND 8 DVNC (TROCAR) ×1
OBTURATOR OPTICALSTD 8 DVNC (TROCAR) ×1 IMPLANT
OCCLUDER COLPOPNEUMO (BALLOONS) ×1 IMPLANT
PACK ROBOT WH (CUSTOM PROCEDURE TRAY) ×1 IMPLANT
PACK ROBOTIC GOWN (GOWN DISPOSABLE) ×1 IMPLANT
PAD OB MATERNITY 4.3X12.25 (PERSONAL CARE ITEMS) ×1 IMPLANT
PAD PREP 24X48 CUFFED NSTRL (MISCELLANEOUS) ×1 IMPLANT
PROTECTOR NERVE ULNAR (MISCELLANEOUS) ×1 IMPLANT
RETRACTOR WND ALEXIS 18 MED (MISCELLANEOUS) IMPLANT
RTRCTR WOUND ALEXIS 18CM MED (MISCELLANEOUS)
RTRCTR WOUND ALEXIS 18CM SML (INSTRUMENTS)
SAVER CELL AAL HAEMONETICS (INSTRUMENTS) IMPLANT
SCISSORS LAP 5X45 EPIX DISP (ENDOMECHANICALS) IMPLANT
SEAL CANN UNIV 5-8 DVNC XI (MISCELLANEOUS) ×4 IMPLANT
SEAL XI 5MM-8MM UNIVERSAL (MISCELLANEOUS) ×4
SEALER VESSEL DA VINCI XI (MISCELLANEOUS) ×1
SEALER VESSEL EXT DVNC XI (MISCELLANEOUS) IMPLANT
SET IRRIG Y TYPE TUR BLADDER L (SET/KITS/TRAYS/PACK) IMPLANT
SET TRI-LUMEN FLTR TB AIRSEAL (TUBING) ×1 IMPLANT
SET TUBE FILTERED XL AIRSEAL (SET/KITS/TRAYS/PACK) IMPLANT
SPIKE FLUID TRANSFER (MISCELLANEOUS) ×2 IMPLANT
SPONGE T-LAP 4X18 ~~LOC~~+RFID (SPONGE) ×1 IMPLANT
SUT VIC AB 0 CT1 27 (SUTURE) ×2
SUT VIC AB 0 CT1 27XBRD ANBCTR (SUTURE) ×2 IMPLANT
SUT VICRYL 0 UR6 27IN ABS (SUTURE) IMPLANT
SUT VICRYL RAPIDE 4/0 PS 2 (SUTURE) ×3 IMPLANT
SUT VLOC 180 0 9IN  GS21 (SUTURE) ×1
SUT VLOC 180 0 9IN GS21 (SUTURE) ×1 IMPLANT
TIP RUMI ORANGE 6.7MMX12CM (TIP) IMPLANT
TIP UTERINE 5.1X6CM LAV DISP (MISCELLANEOUS) IMPLANT
TIP UTERINE 6.7X10CM GRN DISP (MISCELLANEOUS) IMPLANT
TIP UTERINE 6.7X6CM WHT DISP (MISCELLANEOUS) IMPLANT
TIP UTERINE 6.7X8CM BLUE DISP (MISCELLANEOUS) IMPLANT
TOWEL OR 17X26 10 PK STRL BLUE (TOWEL DISPOSABLE) ×1 IMPLANT
TROCAR PORT AIRSEAL 8X120 (TROCAR) ×1 IMPLANT
WATER STERILE IRR 1000ML POUR (IV SOLUTION) ×1 IMPLANT

## 2022-03-29 NOTE — Anesthesia Postprocedure Evaluation (Signed)
Anesthesia Post Note  Patient: Zuzu Befort  Procedure(s) Performed: XI ROBOTIC ASSISTED LAPAROSCOPIC HYSTERECTOMY AND SALPINGECTOMY (Bilateral: Abdomen)     Patient location during evaluation: PACU Anesthesia Type: General Level of consciousness: awake and alert Pain management: pain level controlled Vital Signs Assessment: post-procedure vital signs reviewed and stable Respiratory status: spontaneous breathing, nonlabored ventilation, respiratory function stable and patient connected to nasal cannula oxygen Cardiovascular status: blood pressure returned to baseline and stable Postop Assessment: no apparent nausea or vomiting Anesthetic complications: no  No notable events documented.  Last Vitals:  Vitals:   03/29/22 1330 03/29/22 1332  BP: (!) 136/91   Pulse: 70   Resp: 10   Temp:    SpO2: 97% 92%    Last Pain:  Vitals:   03/29/22 1330  TempSrc:   PainSc: 3                  Tenee Wish S

## 2022-03-29 NOTE — Anesthesia Procedure Notes (Signed)
Procedure Name: Intubation Date/Time: 03/29/2022 10:56 AM  Performed by: Rogers Blocker, CRNAPre-anesthesia Checklist: Patient identified, Emergency Drugs available, Suction available and Patient being monitored Patient Re-evaluated:Patient Re-evaluated prior to induction Oxygen Delivery Method: Circle System Utilized Preoxygenation: Pre-oxygenation with 100% oxygen Induction Type: IV induction Ventilation: Mask ventilation without difficulty Laryngoscope Size: Mac and 3 Grade View: Grade I Tube type: Oral Tube size: 7.0 mm Number of attempts: 1 Airway Equipment and Method: Stylet Placement Confirmation: ETT inserted through vocal cords under direct vision, positive ETCO2 and breath sounds checked- equal and bilateral Secured at: 22 cm Tube secured with: Tape Dental Injury: Teeth and Oropharynx as per pre-operative assessment

## 2022-03-29 NOTE — Transfer of Care (Signed)
Immediate Anesthesia Transfer of Care Note  Patient: Cindy Curry  Procedure(s) Performed: XI ROBOTIC ASSISTED LAPAROSCOPIC HYSTERECTOMY AND SALPINGECTOMY (Bilateral: Abdomen)  Patient Location: PACU  Anesthesia Type:General  Level of Consciousness: drowsy, patient cooperative, and responds to stimulation  Airway & Oxygen Therapy: Patient Spontanous Breathing and Patient connected to face mask oxygen  Post-op Assessment: Report given to RN and Post -op Vital signs reviewed and stable  Post vital signs: Reviewed and stable  Last Vitals:  Vitals Value Taken Time  BP 151/102 03/29/22 1251  Temp    Pulse 94 03/29/22 1256  Resp 18 03/29/22 1256  SpO2 97 % 03/29/22 1256  Vitals shown include unvalidated device data.  Last Pain:  Vitals:   03/29/22 0836  TempSrc: Oral  PainSc: 0-No pain      Patients Stated Pain Goal: 5 (01/01/10 7356)  Complications: No notable events documented.

## 2022-03-29 NOTE — Op Note (Signed)
03/29/2022  12:44 PM  PATIENT:  Cindy Curry  47 y.o. female  PRE-OPERATIVE DIAGNOSIS:  Menorrhagia  Fibroids  Dysmenorrhea  POST-OPERATIVE DIAGNOSIS:  Menorrhagia Fibroids Dysmenorrhea  PROCEDURE:  Procedure(s): XI ROBOTIC ASSISTED LAPAROSCOPIC HYSTERECTOMY AND SALPINGECTOMY (Bilateral)  SURGEON:  Surgeon(s) and Role:    Christophe Louis, MD - Primary  PHYSICIAN ASSISTANT:   ASSISTANTS: Gaylord Shih RNFA assisted due to complexity of the anatomy    ANESTHESIA:   general  EBL:  25 mL   BLOOD ADMINISTERED:none  DRAINS: Urinary Catheter (Foley)   LOCAL MEDICATIONS USED:  OTHER ropivicaine   SPECIMEN:  Source of Specimen:  uterus cervix and bilateral fallopian tubes   DISPOSITION OF SPECIMEN:  PATHOLOGY  COUNTS:  YES  TOURNIQUET:  * No tourniquets in log *  DICTATION: .Note written in EPIC  PLAN OF CARE: Admit for overnight observation  PATIENT DISPOSITION:  PACU - hemodynamically stable.   Delay start of Pharmacological VTE agent (>24hrs) due to surgical blood loss or risk of bleeding: not applicable  Findings: normal external genitalia vaginal mucosa and cervix.Marland Kitchen normal uterus . Normal fallopian tubes and ovaries bilaterally.   Procedure: The patient was taken to the operating room #5 [facility] where she was placed under general anesthesia. Time out was performed.  She was placed in dorsal lithotomy position and prepped and draped in the usual sterile fashion. A weighted speculum was placed into the vagina. A Deaver was placed anteriorly for retraction. The anterior lip of the cervix was grasped with a single-tooth tenaculum. The vaginal mucosa was injected with 2.5 cc of ropivacaine at the 2/4/ 8 and 10 o'clock positions. The uterus was sounded to 8 cm. the cervix was dilated to 6 mm . 0 vicryl suture placed at the 12 and 6:00 positions Of the cervix to facilitate placement of a Ru mi uterine manipulator. The manipulator was placed without difficulty. Weighted speculum and  Deaver were removed.  Attention was turned to the patient's abdomen where a 8 mm trocar was placed at the umbilicus under direct visualization . The pneumoperitoneum was achieved with PCO2 gas. The laparoscope was removed. 60 cc of ropivacaine were injected into the abdominal cavity. The laparoscope was reinserted. . An 8MM incision was made in the Right upper quadrant TROCAR WAS PLACED 8 cm from the umbilicus. Later connected to robotic arm #3. An 8 mm incision was made in the left upper quadrant 16 cm from the umbilicus and connected to robot arm #1. ( All incision sites were injected with 10cc of ropivacaine prior to port placement. )  Once all ports had been placed under direct visualization.The laparoscope was removed and the [employer] robotic system was then right-sided docked. The robotic arms were connected to the corresponding trocars as listed above. The laparoscope was then reinserted. The long tip bipolar forceps were placed into port #1.  A vessel sealer ( alternating with scissors) was placed in port #3. All instruments were directed into the pelvis under direct visualization.   Attention was turned to the surgeons console.. The left mesosalpinx and left utero-ovarian ligament was cauterized and transected with the vessel sealer The broad ligament was cauterized and transected with the vessel sealer .The round ligament was cauterized and transected with the vessel sealer  The anterior leaf of broad ligament was incised along the bladder reflection to the midline.  The right  mesosalpinx and right utero-ovarian ligament was cauterized and transected with the vessel sealer. The right broad ligament was cauterized and transected  with the vessel sealer. The right round ligament was cauterized and transected with the vessel sealer The broad ligament was incised to the midline. The bladder was dissected off the lower uterine segments of the cervix via sharp and blunt dissection.   The uterine arteries  were skeleton bilaterally. They were  cauterized and transected with the vessel sealer The KOH ring was identified. The anterior colpotomy was performed followed by the posterior colpotomy. Once the uterus,cervix and bilateral fallopian tubes were completely excised was removed through the vagina. The  bipolar forceps and scissors were removed and  cobra forceps were placed in the port #1 and the mega needle driver was placed in to port #3.  The vaginal cuff was closed with running suture if 0 v-lock. The pelvis was irrigated. Marland KitchenMarland KitchenMarland KitchenExcellent hemostasis was noted. Arista was placed along the vaginal cuff.  All pelvic pedicles were examined and hemostasis was noted.  All instruments removed from the ports. All ports were removed under direct Visualization. The pneumoperitoneum was released. The skin incisions were closed with 4-0 Vicryl and then covered with Derma bond.     Sponge lap and needle counts weIre correct x. The patient was awakened from anesthesia and taken to the recovery room in stable condition.

## 2022-03-29 NOTE — Anesthesia Preprocedure Evaluation (Signed)
Anesthesia Evaluation  Patient identified by MRN, date of birth, ID band Patient awake    Reviewed: Allergy & Precautions, H&P , NPO status , Patient's Chart, lab work & pertinent test results  Airway Mallampati: II  TM Distance: >3 FB Neck ROM: Full    Dental no notable dental hx.    Pulmonary neg pulmonary ROS, former smoker   Pulmonary exam normal breath sounds clear to auscultation       Cardiovascular hypertension, Pt. on medications Normal cardiovascular exam Rhythm:Regular Rate:Normal     Neuro/Psych negative neurological ROS  negative psych ROS   GI/Hepatic negative GI ROS, Neg liver ROS,,,  Endo/Other  diabetes, Type 2    Renal/GU negative Renal ROS  negative genitourinary   Musculoskeletal negative musculoskeletal ROS (+)    Abdominal   Peds negative pediatric ROS (+)  Hematology negative hematology ROS (+)   Anesthesia Other Findings   Reproductive/Obstetrics negative OB ROS                             Anesthesia Physical Anesthesia Plan  ASA: 2  Anesthesia Plan: General   Post-op Pain Management: Tylenol PO (pre-op)*   Induction: Intravenous  PONV Risk Score and Plan: 3 and Ondansetron, Dexamethasone, Droperidol, Midazolam and Treatment may vary due to age or medical condition  Airway Management Planned: Oral ETT  Additional Equipment:   Intra-op Plan:   Post-operative Plan: Extubation in OR  Informed Consent: I have reviewed the patients History and Physical, chart, labs and discussed the procedure including the risks, benefits and alternatives for the proposed anesthesia with the patient or authorized representative who has indicated his/her understanding and acceptance.     Dental advisory given  Plan Discussed with: CRNA and Surgeon  Anesthesia Plan Comments:        Anesthesia Quick Evaluation

## 2022-03-29 NOTE — Progress Notes (Signed)
Patient's vital signs were checked. BP was 107/107. MD made aware. New orders were given to restart patient's Hyzaar med.

## 2022-03-29 NOTE — H&P (Signed)
Date of Initial H&P: 03/29/2022  History reviewed, patient examined, no change in status, stable for surgery.

## 2022-03-30 ENCOUNTER — Encounter (HOSPITAL_BASED_OUTPATIENT_CLINIC_OR_DEPARTMENT_OTHER): Payer: Self-pay | Admitting: Obstetrics and Gynecology

## 2022-03-30 DIAGNOSIS — D259 Leiomyoma of uterus, unspecified: Secondary | ICD-10-CM | POA: Diagnosis not present

## 2022-03-30 LAB — HEMOGLOBIN: Hemoglobin: 12.6 g/dL (ref 12.0–15.0)

## 2022-03-30 LAB — GLUCOSE, CAPILLARY: Glucose-Capillary: 101 mg/dL — ABNORMAL HIGH (ref 70–99)

## 2022-03-30 MED ORDER — IBUPROFEN 200 MG PO TABS
ORAL_TABLET | ORAL | Status: AC
  Filled 2022-03-30: qty 3

## 2022-03-30 MED ORDER — OXYCODONE HCL 5 MG PO TABS
ORAL_TABLET | ORAL | Status: AC
  Filled 2022-03-30: qty 1

## 2022-03-30 MED ORDER — ACETAMINOPHEN 500 MG PO TABS
ORAL_TABLET | ORAL | Status: AC
  Filled 2022-03-30: qty 2

## 2022-03-30 NOTE — Discharge Summary (Signed)
Physician Discharge Summary  Patient ID: Cindy Curry MRN: [id number] DOB/AGE: 1975/05/05 47 y.o.  Admit date: 03/29/2022 Discharge date: 03/30/2022  Admission Diagnoses:  Discharge Diagnoses:  Principal Problem:   Fibroids Active Problems:   S/P laparoscopic hysterectomy   Discharged Condition: stable  Hospital Course:Pt was admitted for observation after undergoing robotic assisted laparoscopic hysterectomy with bilateral salpingectomy. She did well postoperatively with return of bladder function. She is tolerating po. Pain is well controlled with ibuprofen and tylenol. She is discharged home on pod #1.   Consults: None  Significant Diagnostic Studies: labs: hgb pod #1 is 12.6  Treatments: surgery: robotic assisted laparoscopic hysterectomy with bilateral salpingectomy   Discharge Exam: Blood pressure 137/89, pulse 61, temperature 98.1 F (36.7 C), resp. rate 18, height '5\' 7"'$  (1.702 m), weight 83.7 kg, last menstrual period 03/29/2022, SpO2 100 %. General appearance: alert, cooperative, and no distress Resp: no distress  GI: soft appropriately tender non-distended no rebound no guarding  Extremities: extremities normal, atraumatic, no cyanosis or edema Incision/Wound: well approximated no erythema or exudate   Disposition: Discharge disposition: 01-Home or Self Care       Discharge Instructions     Call MD for:  persistant nausea and vomiting   Complete by: As directed    Call MD for:  persistant nausea and vomiting   Complete by: As directed    Call MD for:  redness, tenderness, or signs of infection (pain, swelling, redness, odor or green/yellow discharge around incision site)   Complete by: As directed    Call MD for:  redness, tenderness, or signs of infection (pain, swelling, redness, odor or green/yellow discharge around incision site)   Complete by: As directed    Call MD for:  severe uncontrolled pain   Complete by: As directed    Call MD for:  severe  uncontrolled pain   Complete by: As directed    Call MD for:  temperature >100.4   Complete by: As directed    Call MD for:  temperature >100.4   Complete by: As directed    Diet - low sodium heart healthy   Complete by: As directed    Diet Carb Modified   Complete by: As directed    Diet Carb Modified   Complete by: As directed    Driving Restrictions   Complete by: As directed    Avoid driving for 1 week   Driving Restrictions   Complete by: As directed    Avoid driving for 1 week   Increase activity slowly   Complete by: As directed    Increase activity slowly   Complete by: As directed    Lifting restrictions   Complete by: As directed    Avoid lifting over 10 lbs   Lifting restrictions   Complete by: As directed    Avoid lifting over 10 lbs   May shower / Bathe   Complete by: As directed    May walk up steps   Complete by: As directed    No wound care   Complete by: As directed    No wound care   Complete by: As directed    Sexual Activity Restrictions   Complete by: As directed    Avoid sex for 6 weeks and until approved by Dr. Landry Curry   Sexual Activity Restrictions   Complete by: As directed    Avoid sex for 6 weeks and until approved by Dr. Landry Curry      Allergies as of 03/30/2022  No Known Allergies      Medication List     TAKE these medications    acetaminophen 500 MG tablet Commonly known as: TYLENOL Take 2 tablets (1,000 mg total) by mouth every 8 (eight) hours as needed. Notes to patient: May take next dose at 3:00 pm today   escitalopram 10 MG tablet Commonly known as: LEXAPRO Take 10 mg by mouth daily.   ibuprofen 600 MG tablet Commonly known as: ADVIL Take 1 tablet (600 mg total) by mouth every 6 (six) hours as needed for mild pain or moderate pain. Notes to patient: May take next dose at 3:00 pm today    losartan-hydrochlorothiazide 100-25 MG tablet Commonly known as: HYZAAR Take 1 tablet by mouth daily.   Mounjaro 5 MG/0.5ML  Pen Generic drug: tirzepatide Inject 5 mg into the skin once a week. Sunday's   NORETHINDRONE PO Take 1 tablet by mouth daily.   oxyCODONE 5 MG immediate release tablet Commonly known as: Oxy IR/ROXICODONE Take 1-2 tablets (5-10 mg total) by mouth every 6 (six) hours as needed for moderate pain. Notes to patient: May take next dose at 3:00 pm today    potassium chloride SA 20 MEQ tablet Commonly known as: KLOR-CON M Take 20 mEq by mouth daily.   rosuvastatin 5 MG tablet Commonly known as: CRESTOR Take 5 mg by mouth daily.   VITAMIN D-3 PO Take 1 capsule by mouth daily.        Follow-up Information     Cindy Louis, MD. Go in 2 week(s).   Specialty: Obstetrics and Gynecology Contact information: [address] [place] [address] [phone]                 Signed: Christophe Curry 03/30/2022, 11:39 AM

## 2022-03-31 LAB — SURGICAL PATHOLOGY
